# Patient Record
Sex: Female | Born: 1950 | Race: Black or African American | Hispanic: No | Marital: Single | Smoking: Former smoker
Health system: Southern US, Community
[De-identification: ages and names within clinical notes are randomized; demographics above are authoritative.]

## PROBLEM LIST (undated history)

## (undated) DIAGNOSIS — I2510 Atherosclerotic heart disease of native coronary artery without angina pectoris: Secondary | ICD-10-CM

## (undated) DIAGNOSIS — K759 Inflammatory liver disease, unspecified: Secondary | ICD-10-CM

## (undated) DIAGNOSIS — Z8719 Personal history of other diseases of the digestive system: Secondary | ICD-10-CM

## (undated) DIAGNOSIS — Z973 Presence of spectacles and contact lenses: Secondary | ICD-10-CM

## (undated) DIAGNOSIS — M1990 Unspecified osteoarthritis, unspecified site: Secondary | ICD-10-CM

## (undated) DIAGNOSIS — I10 Essential (primary) hypertension: Secondary | ICD-10-CM

## (undated) HISTORY — PX: COLONOSCOPY: SHX174

## (undated) HISTORY — DX: Atherosclerotic heart disease of native coronary artery without angina pectoris: I25.10

## (undated) HISTORY — PX: BREAST SURGERY: SHX581

## (undated) HISTORY — PX: TUBAL LIGATION: SHX77

---

## 1999-10-12 ENCOUNTER — Ambulatory Visit (HOSPITAL_COMMUNITY): Admission: RE | Admit: 1999-10-12 | Discharge: 1999-10-12 | Payer: Self-pay | Admitting: Gastroenterology

## 2001-07-01 ENCOUNTER — Other Ambulatory Visit: Admission: RE | Admit: 2001-07-01 | Discharge: 2001-07-01 | Payer: Self-pay | Admitting: Internal Medicine

## 2002-12-24 ENCOUNTER — Other Ambulatory Visit: Admission: RE | Admit: 2002-12-24 | Discharge: 2002-12-24 | Payer: Self-pay | Admitting: *Deleted

## 2003-06-12 HISTORY — PX: SEPTOPLASTY: SUR1290

## 2004-01-12 ENCOUNTER — Other Ambulatory Visit: Admission: RE | Admit: 2004-01-12 | Discharge: 2004-01-12 | Payer: Self-pay | Admitting: Obstetrics and Gynecology

## 2004-01-26 ENCOUNTER — Encounter: Admission: RE | Admit: 2004-01-26 | Discharge: 2004-01-26 | Payer: Self-pay | Admitting: Obstetrics and Gynecology

## 2004-04-25 ENCOUNTER — Ambulatory Visit (HOSPITAL_BASED_OUTPATIENT_CLINIC_OR_DEPARTMENT_OTHER): Admission: RE | Admit: 2004-04-25 | Discharge: 2004-04-25 | Payer: Self-pay | Admitting: Otolaryngology

## 2005-05-07 ENCOUNTER — Other Ambulatory Visit: Admission: RE | Admit: 2005-05-07 | Discharge: 2005-05-07 | Payer: Self-pay | Admitting: Obstetrics and Gynecology

## 2012-10-11 ENCOUNTER — Emergency Department (HOSPITAL_COMMUNITY)
Admission: EM | Admit: 2012-10-11 | Discharge: 2012-10-11 | Disposition: A | Discharge status: Home / Self Care | Payer: BC Managed Care – PPO | Source: Home / Self Care

## 2012-10-11 ENCOUNTER — Encounter (HOSPITAL_COMMUNITY): Payer: Self-pay | Admitting: Emergency Medicine

## 2012-10-11 DIAGNOSIS — M791 Myalgia, unspecified site: Secondary | ICD-10-CM

## 2012-10-11 DIAGNOSIS — R252 Cramp and spasm: Secondary | ICD-10-CM

## 2012-10-11 DIAGNOSIS — IMO0001 Reserved for inherently not codable concepts without codable children: Secondary | ICD-10-CM

## 2012-10-11 DIAGNOSIS — R112 Nausea with vomiting, unspecified: Secondary | ICD-10-CM

## 2012-10-11 LAB — POCT I-STAT, CHEM 8
BUN: 28 mg/dL — ABNORMAL HIGH (ref 6–23)
Chloride: 99 mEq/L (ref 96–112)
Glucose, Bld: 130 mg/dL — ABNORMAL HIGH (ref 70–99)
Sodium: 138 mEq/L (ref 135–145)
TCO2: 26 mmol/L (ref 0–100)

## 2012-10-11 MED ORDER — TRAMADOL HCL 50 MG PO TABS: 50.0000 mg | ORAL_TABLET | Freq: Four times a day (QID) | ORAL | Status: DC | PRN

## 2012-10-11 MED ORDER — ONDANSETRON HCL 4 MG PO TABS: 4.0000 mg | ORAL_TABLET | Freq: Four times a day (QID) | ORAL | Status: DC

## 2012-10-11 NOTE — ED Provider Notes (Signed)
Medical screening examination/treatment/procedure(s) were performed by non-physician practitioner and as supervising physician I was immediately available for consultation/collaboration.   [facility]; MD  Sharin Grave, MD 10/11/12 2001

## 2012-10-11 NOTE — ED Notes (Signed)
Pt c/o hand,finger, and leg cramps. Pt has a hx of arthritis.  Nausea. Decrease in appetite. Pt has been taking pepto with no relief.  And tylenol for arthritis pain

## 2012-10-11 NOTE — ED Provider Notes (Signed)
History     CSN: [id number]  Arrival date & time 10/11/12  1304   First MD Initiated Contact with Patient 10/11/12 1533      Chief Complaint  Patient presents with  . Arthritis    muscle cramps in legs and hands.  . Nausea    x 4 days decrease appetite.    (Consider location/radiation/quality/duration/timing/severity/associated sxs/prior treatment) HPI Comments: 62 year old female presents with a myriad of chronic complaints and 1 acute concern. The acute concern involves nausea and vomiting for the past 4 days. The nausea is greater than a few times and she has vomited. She has been able to hold down most liquids and eat some. She denies abdominal pain. Also denies diarrhea. She is to having irregular stools for years. Chronic complaints include muscle cramping for over one year but worse in the past few days. These cramps occur "all over my body" sometimes it helps to walk around to relieve her cramps. The cause of her nausea she took Pepto-Bismol and later noticed that her stools were black tarry. She cannot recall whether they were black and tarry prior to or after the Pepto-Bismol. She has intermittent discomfort in her hands and various joints. Does not appear to be acutely ill vital signs are stable. She has a history of hypertension and her physician recently change her medication to losartan/HCTZ.   History reviewed. No pertinent past medical history.  History reviewed. No pertinent past surgical history.  History reviewed. No pertinent family history.  History  Substance Use Topics  . Smoking status: Never Smoker   . Smokeless tobacco: Not on file  . Alcohol Use: Yes    OB History   Grav Para Term Preterm Abortions TAB SAB Ect Mult Living                  Review of Systems  Constitutional: Positive for activity change and fatigue. Negative for fever.  HENT: Negative.   Respiratory: Negative.   Cardiovascular: Negative.   Gastrointestinal:       As per  history of present illness  Genitourinary: Negative.   Musculoskeletal: Positive for myalgias and arthralgias.  Skin: Negative.   Neurological: Positive for weakness. Negative for tremors, seizures, syncope and speech difficulty.  Psychiatric/Behavioral: Positive for sleep disturbance.    Allergies  Review of patient's allergies indicates no known allergies.  Home Medications   Current Outpatient Rx  Name  Route  Sig  Dispense  Refill  . ondansetron (ZOFRAN) 4 MG tablet   Oral   Take 1 tablet (4 mg total) by mouth every 6 (six) hours.   12 tablet   0   . traMADol (ULTRAM) 50 MG tablet   Oral   Take 1 tablet (50 mg total) by mouth every 6 (six) hours as needed for pain.   15 tablet   0     BP 144/91  Pulse 65  Temp(Src) 97.5 F (36.4 C) (Oral)  Resp 19  SpO2 95%  Physical Exam  Nursing note and vitals reviewed. Constitutional: She is oriented to person, place, and time. She appears well-developed and well-nourished. No distress.  HENT:  Mouth/Throat: Oropharynx is clear and moist. No oropharyngeal exudate.  Eyes: EOM are normal.  Neck: Normal range of motion. Neck supple.  Cardiovascular: Normal rate, regular rhythm and normal heart sounds.   Pulmonary/Chest: Effort normal and breath sounds normal. No respiratory distress. She has no wheezes. She has no rales.  Abdominal: Soft. She exhibits no distension. There is  no tenderness.  Genitourinary: Guaiac negative stool.  Anorectal exam is normal, small amount of brown stool in rectal vault. No tenderness.  Musculoskeletal: Normal range of motion. She exhibits no edema.  Mild tenderness and some muscles of the extremities and back. Somewhat nonspecific.  Lymphadenopathy:    She has no cervical adenopathy.  Neurological: She is alert and oriented to person, place, and time. She exhibits normal muscle tone. Coordination normal.  Skin: Skin is warm and dry. No rash noted.  Psychiatric: She has a normal mood and affect.     ED Course  Procedures (including critical care time)  Labs Reviewed  POCT I-STAT, CHEM 8 - Abnormal; Notable for the following:    BUN 28 (*)    Creatinine, Ser 1.50 (*)    Glucose, Bld 130 (*)    Hemoglobin 18.4 (*)    HCT 54.0 (*)    All other components within normal limits   No results found.  Results for orders placed during the hospital encounter of 10/11/12  POCT I-STAT, CHEM 8      Result Value Range   Sodium 138  135 - 145 mEq/L   Potassium 3.8  3.5 - 5.1 mEq/L   Chloride 99  96 - 112 mEq/L   BUN 28 (*) 6 - 23 mg/dL   Creatinine, Ser 8.11 (*) 0.50 - 1.10 mg/dL   Glucose, Bld 914 (*) 70 - 99 mg/dL   Calcium, Ion 7.82  9.56 - 1.30 mmol/L   TCO2 26  0 - 100 mmol/L   Hemoglobin 18.4 (*) 12.0 - 15.0 g/dL   HCT 21.3 (*) 08.6 - 57.8 %      1. Myalgia   2. Muscle cramps   3. Nausea & vomiting       MDM  Blood tests is normal does not offer an etiology for her chronic leg cramps and myalgias. I suspect her nausea and vomiting may be due to a viral gastritis which has been epidemic to the area for the past 6 weeks. That has improved somewhat. Zofran 4 mg by mouth every 6 hours when necessary nausea and vomiting Tramadol 50 mg every 6 hours when necessary cramps and pain. Heat followup with your primary care doctor next week.        Hayden Rasmussen, NP 10/11/12 1652

## 2012-10-13 LAB — OCCULT BLOOD, POC DEVICE: Fecal Occult Bld: NEGATIVE

## 2013-02-05 ENCOUNTER — Other Ambulatory Visit: Payer: Self-pay | Admitting: Obstetrics and Gynecology

## 2013-02-05 DIAGNOSIS — R928 Other abnormal and inconclusive findings on diagnostic imaging of breast: Secondary | ICD-10-CM

## 2013-02-24 ENCOUNTER — Ambulatory Visit
Admission: RE | Admit: 2013-02-24 | Discharge: 2013-02-24 | Disposition: A | Discharge status: Home / Self Care | Payer: BC Managed Care – PPO | Source: Ambulatory Visit | Attending: Obstetrics and Gynecology | Admitting: Obstetrics and Gynecology

## 2013-02-24 DIAGNOSIS — R928 Other abnormal and inconclusive findings on diagnostic imaging of breast: Secondary | ICD-10-CM

## 2013-06-10 ENCOUNTER — Other Ambulatory Visit (HOSPITAL_COMMUNITY): Payer: BC Managed Care – PPO

## 2013-08-19 ENCOUNTER — Ambulatory Visit (HOSPITAL_COMMUNITY): Payer: BC Managed Care – PPO | Attending: Cardiovascular Disease | Admitting: Cardiology

## 2013-08-19 ENCOUNTER — Encounter: Payer: Self-pay | Admitting: Cardiovascular Disease

## 2013-08-19 ENCOUNTER — Other Ambulatory Visit (HOSPITAL_COMMUNITY): Payer: Self-pay | Admitting: Internal Medicine

## 2013-08-19 DIAGNOSIS — I509 Heart failure, unspecified: Secondary | ICD-10-CM | POA: Insufficient documentation

## 2013-08-19 NOTE — Progress Notes (Signed)
Echo performed. 

## 2013-08-22 ENCOUNTER — Encounter: Payer: Self-pay | Admitting: *Deleted

## 2013-08-22 DIAGNOSIS — I10 Essential (primary) hypertension: Secondary | ICD-10-CM | POA: Insufficient documentation

## 2013-09-07 ENCOUNTER — Other Ambulatory Visit: Payer: Self-pay | Admitting: Gastroenterology

## 2013-10-05 ENCOUNTER — Encounter (HOSPITAL_COMMUNITY): Payer: Self-pay | Admitting: Pharmacy Technician

## 2013-10-12 ENCOUNTER — Other Ambulatory Visit: Payer: Self-pay | Admitting: Internal Medicine

## 2013-10-12 DIAGNOSIS — R945 Abnormal results of liver function studies: Principal | ICD-10-CM

## 2013-10-12 DIAGNOSIS — R7989 Other specified abnormal findings of blood chemistry: Secondary | ICD-10-CM

## 2013-10-14 ENCOUNTER — Encounter (HOSPITAL_COMMUNITY): Payer: Self-pay | Admitting: *Deleted

## 2013-10-14 ENCOUNTER — Encounter (INDEPENDENT_AMBULATORY_CARE_PROVIDER_SITE_OTHER): Payer: Self-pay

## 2013-10-14 ENCOUNTER — Ambulatory Visit
Admission: RE | Admit: 2013-10-14 | Discharge: 2013-10-14 | Disposition: A | Discharge status: Home / Self Care | Payer: BC Managed Care – PPO | Source: Ambulatory Visit | Attending: Internal Medicine | Admitting: Internal Medicine

## 2013-10-14 DIAGNOSIS — R945 Abnormal results of liver function studies: Principal | ICD-10-CM

## 2013-10-14 DIAGNOSIS — R7989 Other specified abnormal findings of blood chemistry: Secondary | ICD-10-CM

## 2013-10-27 ENCOUNTER — Encounter (HOSPITAL_COMMUNITY)
Admission: RE | Disposition: A | Discharge status: Home / Self Care | Payer: Self-pay | Source: Ambulatory Visit | Attending: Gastroenterology

## 2013-10-27 ENCOUNTER — Ambulatory Visit (HOSPITAL_COMMUNITY): Payer: BC Managed Care – PPO | Admitting: Anesthesiology

## 2013-10-27 ENCOUNTER — Ambulatory Visit (HOSPITAL_COMMUNITY)
Admission: RE | Admit: 2013-10-27 | Discharge: 2013-10-27 | Disposition: A | Discharge status: Home / Self Care | Payer: BC Managed Care – PPO | Source: Ambulatory Visit | Attending: Gastroenterology | Admitting: Gastroenterology

## 2013-10-27 ENCOUNTER — Encounter (HOSPITAL_COMMUNITY): Payer: Self-pay | Admitting: *Deleted

## 2013-10-27 ENCOUNTER — Encounter (HOSPITAL_COMMUNITY): Payer: BC Managed Care – PPO | Admitting: Anesthesiology

## 2013-10-27 DIAGNOSIS — Z1211 Encounter for screening for malignant neoplasm of colon: Secondary | ICD-10-CM | POA: Insufficient documentation

## 2013-10-27 DIAGNOSIS — I10 Essential (primary) hypertension: Secondary | ICD-10-CM | POA: Insufficient documentation

## 2013-10-27 DIAGNOSIS — F17200 Nicotine dependence, unspecified, uncomplicated: Secondary | ICD-10-CM | POA: Insufficient documentation

## 2013-10-27 HISTORY — DX: Inflammatory liver disease, unspecified: K75.9

## 2013-10-27 HISTORY — DX: Unspecified osteoarthritis, unspecified site: M19.90

## 2013-10-27 HISTORY — PX: COLONOSCOPY WITH PROPOFOL: SHX5780

## 2013-10-27 HISTORY — DX: Personal history of other diseases of the digestive system: Z87.19

## 2013-10-27 HISTORY — DX: Essential (primary) hypertension: I10

## 2013-10-27 SURGERY — COLONOSCOPY WITH PROPOFOL
Anesthesia: Monitor Anesthesia Care

## 2013-10-27 MED ORDER — PROPOFOL 10 MG/ML IV BOLUS
INTRAVENOUS | Status: DC | PRN
  Administered 2013-10-27 (×5): 50 mg via INTRAVENOUS

## 2013-10-27 MED ORDER — PROPOFOL 10 MG/ML IV BOLUS
INTRAVENOUS | Status: AC
  Filled 2013-10-27: qty 20

## 2013-10-27 MED ORDER — SODIUM CHLORIDE 0.9 % IV SOLN: INTRAVENOUS | Status: DC

## 2013-10-27 MED ORDER — LACTATED RINGERS IV SOLN
INTRAVENOUS | Status: DC
  Administered 2013-10-27: 1000 mL via INTRAVENOUS

## 2013-10-27 MED ORDER — LACTATED RINGERS IV SOLN
INTRAVENOUS | Status: DC | PRN
  Administered 2013-10-27: 11:00:00 via INTRAVENOUS

## 2013-10-27 SURGICAL SUPPLY — 22 items

## 2013-10-27 NOTE — H&P (Signed)
  Procedure: Screening colonoscopy. Normal screening colonoscopy performed 10/12/1999  History: The patient is a 63 year old female born [birth year]. She is scheduled to undergo a repeat screening colonoscopy with polypectomy to prevent colon cancer.  Past medical history: Chronic viral hepatitis C. Seasonal allergies. Hypertension. Diastolic dysfunction complicated by heart failure. Left bundle branch block pattern by electrocardiogram. Septoplasty.  Medication allergies: None  Exam: The patient is alert and lying comfortably on the endoscopy stretcher. Abdomen is soft and nontender to palpation. Lungs are clear to auscultation. Cardiac exam reveals a regular rhythm.  Plan: Proceed with repeat screening colonoscopy

## 2013-10-27 NOTE — Anesthesia Postprocedure Evaluation (Signed)
  Anesthesia Post-op Note  Patient: Patricia Aguirre  Procedure(s) Performed: Procedure(s) (LRB): COLONOSCOPY WITH PROPOFOL (N/A)  Patient Location: PACU  Anesthesia Type: MAC  Level of Consciousness: awake and alert   Airway and Oxygen Therapy: Patient Spontanous Breathing  Post-op Pain: mild  Post-op Assessment: Post-op Vital signs reviewed, Patient's Cardiovascular Status Stable, Respiratory Function Stable, Patent Airway and No signs of Nausea or vomiting  Last Vitals:  Filed Vitals:   10/27/13 1139  BP: 93/51  Pulse: 55  Temp:   Resp: 18    Post-op Vital Signs: stable   Complications: No apparent anesthesia complications

## 2013-10-27 NOTE — Transfer of Care (Signed)
Immediate Anesthesia Transfer of Care Note  Patient: Patricia Aguirre  Procedure(s) Performed: Procedure(s): COLONOSCOPY WITH PROPOFOL (N/A)  Patient Location: PACU  Anesthesia Type:MAC  Level of Consciousness: awake, sedated and patient cooperative  Airway & Oxygen Therapy: Patient Spontanous Breathing and Patient connected to face mask oxygen  Post-op Assessment: Report given to PACU RN and Post -op Vital signs reviewed and stable  Post vital signs: Reviewed and stable  Complications: No apparent anesthesia complications

## 2013-10-27 NOTE — Discharge Instructions (Signed)
Colonoscopy, Care After °Refer to this sheet in the next few weeks. These instructions provide you with information on caring for yourself after your procedure. Your health care provider may also give you more specific instructions. Your treatment has been planned according to current medical practices, but problems sometimes occur. Call your health care provider if you have any problems or questions after your procedure. °WHAT TO EXPECT AFTER THE PROCEDURE  °After your procedure, it is typical to have the following: °· A small amount of blood in your stool. °· Moderate amounts of gas and mild abdominal cramping or bloating. °HOME CARE INSTRUCTIONS °· Do not drive, operate machinery, or sign important documents for 24 hours. °· You may shower and resume your regular physical activities, but move at a slower pace for the first 24 hours. °· Take frequent rest periods for the first 24 hours. °· Walk around or put a warm pack on your abdomen to help reduce abdominal cramping and bloating. °· Drink enough fluids to keep your urine clear or pale yellow. °· You may resume your normal diet as instructed by your health care provider. Avoid heavy or fried foods that are hard to digest. °· Avoid drinking alcohol for 24 hours or as instructed by your health care provider. °· Only take over-the-counter or prescription medicines as directed by your health care provider. °· If a tissue sample (biopsy) was taken during your procedure: °· Do not take aspirin or blood thinners for 7 days, or as instructed by your health care provider. °· Do not drink alcohol for 7 days, or as instructed by your health care provider. °· Eat soft foods for the first 24 hours. °SEEK MEDICAL CARE IF: °You have persistent spotting of blood in your stool 2 3 days after the procedure. °SEEK IMMEDIATE MEDICAL CARE IF: °· You have more than a small spotting of blood in your stool. °· You pass large blood clots in your stool. °· Your abdomen is swollen  (distended). °· You have nausea or vomiting. °· You have a fever. °· You have increasing abdominal pain that is not relieved with medicine. °Document Released: 01/10/2004 Document Revised: 03/18/2013 Document Reviewed: 02/02/2013 °ExitCare® Patient Information ©2014 [employer]. ° °

## 2013-10-27 NOTE — Anesthesia Preprocedure Evaluation (Signed)
Anesthesia Evaluation  Patient identified by MRN, date of birth, ID band Patient awake    Reviewed: Allergy & Precautions, H&P , NPO status , Patient's Chart, lab work & pertinent test results  Airway Mallampati: II TM Distance: >3 FB Neck ROM: Full    Dental no notable dental hx.    Pulmonary Current Smoker,  breath sounds clear to auscultation  Pulmonary exam normal       Cardiovascular hypertension, Pt. on medications Rhythm:Regular Rate:Normal     Neuro/Psych negative neurological ROS  negative psych ROS   GI/Hepatic negative GI ROS, Neg liver ROS,   Endo/Other  negative endocrine ROS  Renal/GU negative Renal ROS  negative genitourinary   Musculoskeletal negative musculoskeletal ROS (+)   Abdominal   Peds negative pediatric ROS (+)  Hematology negative hematology ROS (+)   Anesthesia Other Findings   Reproductive/Obstetrics negative OB ROS                           Anesthesia Physical Anesthesia Plan  ASA: II  Anesthesia Plan: MAC   Post-op Pain Management:    Induction: Intravenous  Airway Management Planned: Nasal Cannula  Additional Equipment:   Intra-op Plan:   Post-operative Plan:   Informed Consent: I have reviewed the patients History and Physical, chart, labs and discussed the procedure including the risks, benefits and alternatives for the proposed anesthesia with the patient or authorized representative who has indicated his/her understanding and acceptance.   Dental advisory given  Plan Discussed with: CRNA and Surgeon  Anesthesia Plan Comments:         Anesthesia Quick Evaluation

## 2013-10-27 NOTE — Op Note (Signed)
Procedure: Screening colonoscopy. Normal screening colonoscopies performed 10/12/1999.  Endoscopist: Danise EdgeMartin Torien Ramroop  Premedication: Propofol administered by anesthesia  Procedure: The patient was placed in the left lateral decubitus position. Anal inspection and digital rectal exam were normal. The Pentax pediatric colonoscope was introduced into the rectum and advanced to the cecum. A normal-appearing appendiceal orifice and ileocecal valve were identified. Colonic preparation for the exam today was good.  Rectum. Normal. Retroflexed view of the distal rectum normal  Sigmoid colon and descending colon. Normal  Splenic flexure. Normal  Transverse colon. Normal  Hepatic flexure. Normal  Ascending colon. Normal  Cecum and ileocecal valve. Normal  Assessment: Normal screening colonoscopy  Recommendation: Schedule repeat screening colonoscopy in 10 years

## 2013-10-28 ENCOUNTER — Encounter (HOSPITAL_COMMUNITY): Payer: Self-pay | Admitting: Gastroenterology

## 2013-11-23 ENCOUNTER — Ambulatory Visit (INDEPENDENT_AMBULATORY_CARE_PROVIDER_SITE_OTHER): Payer: BC Managed Care – PPO | Admitting: Internal Medicine

## 2013-11-23 ENCOUNTER — Encounter: Payer: Self-pay | Admitting: Internal Medicine

## 2013-11-23 VITALS — BP 122/78 | HR 80 | Temp 98.4°F | Ht 66.0 in | Wt 161.0 lb

## 2013-11-23 DIAGNOSIS — G9340 Encephalopathy, unspecified: Secondary | ICD-10-CM

## 2013-11-23 DIAGNOSIS — B1920 Unspecified viral hepatitis C without hepatic coma: Secondary | ICD-10-CM

## 2013-11-23 DIAGNOSIS — K7290 Hepatic failure, unspecified without coma: Secondary | ICD-10-CM | POA: Insufficient documentation

## 2013-11-23 DIAGNOSIS — K7682 Hepatic encephalopathy: Secondary | ICD-10-CM | POA: Insufficient documentation

## 2013-11-23 LAB — COMPLETE METABOLIC PANEL WITH GFR
ALBUMIN: 3.3 g/dL — AB (ref 3.5–5.2)
ALK PHOS: 140 U/L — AB (ref 39–117)
ALT: 47 U/L — ABNORMAL HIGH (ref 0–35)
AST: 91 U/L — AB (ref 0–37)
BUN: 8 mg/dL (ref 6–23)
CALCIUM: 9 mg/dL (ref 8.4–10.5)
CO2: 25 mEq/L (ref 19–32)
Chloride: 109 mEq/L (ref 96–112)
Creat: 0.61 mg/dL (ref 0.50–1.10)
GFR, Est African American: >89 mL/min
GFR, Est Non African American: >89 mL/min
Glucose, Bld: 100 mg/dL — ABNORMAL HIGH (ref 70–99)
POTASSIUM: 3.4 meq/L — AB (ref 3.5–5.3)
SODIUM: 140 meq/L (ref 135–145)
TOTAL PROTEIN: 7.5 g/dL (ref 6.0–8.3)
Total Bilirubin: 1.2 mg/dL (ref 0.2–1.2)

## 2013-11-23 LAB — CBC WITH DIFFERENTIAL/PLATELET
BASOS PCT: 1 % (ref 0–1)
Basophils Absolute: 0.1 10*3/uL (ref 0.0–0.1)
EOS ABS: 0.1 10*3/uL (ref 0.0–0.7)
Eosinophils Relative: 2 % (ref 0–5)
HEMATOCRIT: 38.9 % (ref 36.0–46.0)
Hemoglobin: 13.5 g/dL (ref 12.0–15.0)
Lymphocytes Relative: 45 % (ref 12–46)
Lymphs Abs: 3.2 10*3/uL (ref 0.7–4.0)
MCH: 32.9 pg (ref 26.0–34.0)
MCHC: 34.7 g/dL (ref 30.0–36.0)
MCV: 94.9 fL (ref 78.0–100.0)
MONO ABS: 0.5 10*3/uL (ref 0.1–1.0)
Monocytes Relative: 7 % (ref 3–12)
Neutro Abs: 3.2 10*3/uL (ref 1.7–7.7)
Neutrophils Relative %: 45 % (ref 43–77)
PLATELETS: 162 10*3/uL (ref 150–400)
RBC: 4.1 MIL/uL (ref 3.87–5.11)
RDW: 14.2 % (ref 11.5–15.5)
WBC: 7 10*3/uL (ref 4.0–10.5)

## 2013-11-23 LAB — IRON: IRON: 211 ug/dL — AB (ref 42–145)

## 2013-11-23 LAB — AMMONIA: Ammonia: 65 umol/L — ABNORMAL HIGH (ref 16–53)

## 2013-11-23 LAB — PROTIME-INR
INR: 1.13 (ref <1.50)
Prothrombin Time: 14.4 seconds (ref 11.6–15.2)

## 2013-11-23 NOTE — Progress Notes (Signed)
+Patricia Aguirre is a 63 y.o. female who presents for initial evaluation and management of a positive Hepatitis C antibody test.  Patient tested positive this year. Test was performed as part of an evaluation of abnormal liver function test:(elevated ALT, elevated AST). Hepatitis C risk factors present are: tattoos (details: done in the 1970s). Patient denies accidental needle stick, acupuncture, history of blood transfusion, history of clotting factor transfusion, intranasal drug use, IV drug abuse, multiple sexual partners, renal dialysis, sexual contact with person with liver disease. Patient has not had other studies performed. Results: hepatitis C RNA by PCR, result: positive. Patient has not had prior treatment for Hepatitis C. Patient does not have a past history of liver disease. Patient does not have a family history of liver disease.   HPI: Patricia Aguirre was recently diagnosed by Dr. Nehemiah SettlePolite and denies any risk factors.  Has a history of mild renal insufficiency but recent labs with normal creatinine.  Also recent positive ANA being evaluated by rheumatology with concern for autoimmune disease.    Patient does not have documented immunity to Hepatitis A. Patient does not have documented immunity to Hepatitis B.     Review of Systems A comprehensive review of systems was negative.   Past Medical History  Diagnosis Date  . Coronary artery disease   . Hypertension   . H/O hiatal hernia   . Arthritis   . Hepatitis     Hep C; dx 1 week ago 10/2113    History  Substance Use Topics  . Smoking status: Current Some Day Smoker -- 0.25 packs/day for 20 years    Types: Cigarettes  . Smokeless tobacco: Not on file  . Alcohol Use: Yes     Comment: socially    Family History  Problem Relation Age of Onset  . Diabetes Mellitus I Mother   . Coronary artery disease Father   . Heart disease Father       Objective:   Filed Vitals:   11/23/13 1117  BP: 122/78  Pulse: 80  Temp: 98.4 F (36.9 C)    in no apparent distress, well developed and well nourished and anicteric.  Patricia Aguirre has slowed mentation and has difficulty concentrating.   HEENT: anicteric Cor RRR and No murmurs clear Bowel sounds are normal, liver is not enlarged, spleen is not enlarged peripheral pulses normal, no pedal edema, no clubbing or cyanosis negative for - jaundice, spider hemangioma, telangiectasia, palmar erythema, ecchymosis and atrophy  Laboratory Genotype: unknown No results found for this basename: hcvgenotype   HCV viral load: 895,960 (through [facility]) No results found for this basename: HCVQUANT   Lab Results  Component Value Date   HGB 18.4* 10/11/2012   HCT 54.0* 10/11/2012    Lab Results  Component Value Date   CREATININE 1.50* 10/11/2012   BUN 28* 10/11/2012   NA 138 10/11/2012   K 3.8 10/11/2012   CL 99 10/11/2012    No results found for this basename: ALT, AST, GGT, ALKPHOS, BILITOT, INR      Assessment: Hepatitis C genotype unknown  Plan: 1) Patient counseled extensively on limiting acetaminophen to no more than 2 grams daily, avoidance of alcohol. 2) Transmission discussed with patient including sexual transmission, sharing razors and toothbrush.   3) Will need referral to gastroenterology: no, awaiting elastrography results.  Has seen [facility] in the past (Dr. Loreta AveMann).   4) Will need referral for substance abuse counseling: no, depending on work up.  5) Hepatitis A vaccine no, will  check antibodies 6) Hepatitis B vaccine no will check antibodies 7) Pneumovax vaccine no 8) Encephalopathy - Patricia Aguirre is slowed in mentation today.  No known history of any encephalopathy.  Will check ammonia, UDS, alcohol, electrolytes, calcium.

## 2013-11-24 LAB — HEPATITIS B SURFACE ANTIBODY,QUALITATIVE: Hep B S Ab: NEGATIVE

## 2013-11-24 LAB — ETHANOL: Alcohol, Ethyl (B): <10 mg/dL (ref 0–10)

## 2013-11-24 LAB — ANA: Anti Nuclear Antibody(ANA): NEGATIVE

## 2013-11-24 LAB — HIV ANTIBODY (ROUTINE TESTING W REFLEX): HIV 1&2 Ab, 4th Generation: NONREACTIVE

## 2013-11-24 LAB — HCV RNA QUANT RFLX ULTRA OR GENOTYP
HCV QUANT LOG: 6.85 {Log} — AB (ref <1.18)
HCV QUANT: 7140780 [IU]/mL — AB (ref <15)

## 2013-11-24 LAB — HEPATITIS B CORE ANTIBODY, TOTAL: Hep B Core Total Ab: NONREACTIVE

## 2013-11-24 LAB — HEPATITIS B SURFACE ANTIGEN: Hepatitis B Surface Ag: NEGATIVE

## 2013-11-24 LAB — HEPATITIS A ANTIBODY, TOTAL: Hep A Total Ab: NONREACTIVE

## 2013-11-25 LAB — HEPATITIS C GENOTYPE

## 2013-12-01 ENCOUNTER — Ambulatory Visit (HOSPITAL_COMMUNITY)
Admission: RE | Admit: 2013-12-01 | Discharge: 2013-12-01 | Disposition: A | Discharge status: Home / Self Care | Payer: BC Managed Care – PPO | Source: Ambulatory Visit | Attending: Internal Medicine | Admitting: Internal Medicine

## 2013-12-01 DIAGNOSIS — B1920 Unspecified viral hepatitis C without hepatic coma: Secondary | ICD-10-CM | POA: Insufficient documentation

## 2013-12-14 ENCOUNTER — Telehealth: Payer: Self-pay | Admitting: *Deleted

## 2013-12-14 ENCOUNTER — Encounter: Payer: Self-pay | Admitting: Internal Medicine

## 2013-12-14 ENCOUNTER — Ambulatory Visit (INDEPENDENT_AMBULATORY_CARE_PROVIDER_SITE_OTHER): Payer: BC Managed Care – PPO | Admitting: Internal Medicine

## 2013-12-14 VITALS — BP 156/84 | HR 65 | Temp 98.4°F | Wt 165.0 lb

## 2013-12-14 DIAGNOSIS — K7290 Hepatic failure, unspecified without coma: Secondary | ICD-10-CM

## 2013-12-14 DIAGNOSIS — B182 Chronic viral hepatitis C: Secondary | ICD-10-CM

## 2013-12-14 DIAGNOSIS — K7460 Unspecified cirrhosis of liver: Secondary | ICD-10-CM

## 2013-12-14 DIAGNOSIS — K7682 Hepatic encephalopathy: Secondary | ICD-10-CM

## 2013-12-14 MED ORDER — LEDIPASVIR-SOFOSBUVIR 90-400 MG PO TABS: 1.0000 | ORAL_TABLET | Freq: Every day | ORAL | Status: DC

## 2013-12-14 NOTE — Progress Notes (Signed)
   Subjective:    Patient ID: Gardiner RamusJean C Wentland, female    DOB: 05-11-1951, 63 y.o.   MRN: [id number]  HPI Comes in for follow up for HCV.  Recently diagnosed and history of tattoos but denies any other risk factors.  Work up has revealed genotype 1b, viral load of 7,140,780, hep A and B non immune.  Elastography with F4 and noted symptoms of hepatic encephalopathy with mild elevation in ammonia level.  Also gives a history of confusion, difficulty concentrating.     Child-Pugh A, though with encephalopathy, mild decrease in albumin.    Will start hepatitis A and B vaccine (pt left before getting it today)  Has had recent pneumovax by her PCP.      Review of Systems     Objective:   Physical Exam        Assessment & Plan:

## 2013-12-14 NOTE — Telephone Encounter (Signed)
Called patient and notified her of appointment [facility] [place] for 12/21/13 at 8:15 AM. Office note, labs, and ultrasound results faxed to [phone]. [name]

## 2013-12-14 NOTE — Assessment & Plan Note (Addendum)
Will prescribe Harvoni.  12 weeks if/when approved.  Will call her to schedule once it is approved.    Will need to take ppi at same time or not at all.  Does not take daily.

## 2013-12-14 NOTE — Assessment & Plan Note (Signed)
Will send to GI

## 2013-12-14 NOTE — Assessment & Plan Note (Addendum)
[facility].  Ultrasound done.  Discussed results with the patient, meaning of cirrhosis, possibility of hepatic encephalopathy as cause of her decline over last few years.  Counseled on avoidance of alcohol (does not drink), avoidance of acetaminophen.   40 minutes spent with 20 minutes spent face to face counseling on issues, medicine.

## 2013-12-14 NOTE — Patient Instructions (Signed)
Date 12/14/2013  Dear Ms Patricia Aguirre, As discussed in the ID Clinic, your hepatitis C therapy will include the following medications:           Harvoni 90mg /400mg  tablet:           Take 1 tablet by mouth once daily   Please note that ALL MEDICATIONS WILL START ON THE SAME DATE for a total of _ weeks. ---------------------------------------------------------------- Your HCV Treatment Start Date: TBA   Your HCV genotype:  1b    Liver Fibrosis:    F4 ---------------------------------------------------------------- YOUR [facility]AC[facility] CONTACT:   [facility] [address] Phone: [phone] Hours: Monday to Friday 7:30 am to 6:00 pm   Please always contact your pharmacy at least 3-4 business days before you run out of medications to ensure your next month's medication is ready or 1 week prior to running out if you receive it by mail.  Remember, each prescription is for 28 days. ---------------------------------------------------------------- GENERAL NOTES REGARDING YOUR HEPATITIS C MEDICATIONS:  SOFOSBUVIR/LEDIPASVIR (HARVONI): - Harvoni tablet is taken daily with OR without food. - The tablets are orange. - The tablets should be stored at room temperature.  - Acid reducing agents such as H2 blockers (ie. Pepcid (famotidine), Zantac (ranitidine), Tagamet (cimetidine), Axid (nizatidine) and proton pump inhibitors (ie. Prilosec (omeprazole), Protonix (pantoprazole), Nexium (esomeprazole), or Aciphex (rabeprazole)) can decrease effectiveness of Harvoni. Do not take until you have discussed with a health care provider.    -Antacids that contain magnesium and/or aluminum hydroxide (ie. Milk of Magensia, Rolaids, Gaviscon, Maalox, Mylanta, an dArthritis Pain Formula)can reduce absorption of Harvoni, so take them at least 4 hours before or after Harvoni.  -Calcium carbonate (calcium supplements or antacids such as Tums, Caltrate,  Os-Cal)needs to be taken at least 4 hours hours before or after Harvoni.  -St. John's wort or any products that contain St. John's wort like some herbal supplements  Please inform the office prior to starting any of these medications.  - The common side effects with Harvoni:      1. Fatigue      2. Headache      3. Nausea      4. Diarrhea      5. Insomnia   Support Path is a suite of resources designed to help patients start with HARVONI and move toward treatment completion GETTING STARTED Support Path helps patients access therapy and get off to an efficient start  Benefits investigation and prior authorization support Co-pay and other financial assistance A specialty pharmacy finder CO-PAY COUPON The HARVONI co-pay coupon may help eligible patients lower their out-of-pocket costs. With a co-pay coupon, most eligible patients may pay no more than $5 per co-pay (restrictions apply) www.harvoni.com call (334)[phone] Not valid for patients enrolled in government healthcare prescription drug programs, such as Medicare Part D and Medicaid. Patients in the coverage gap known as the "donut hole" also are not eligible The HARVONI co-pay coupon program will cover the out-of-pocket costs for HARVONI prescriptions up to a maximum of 25% of the catalog price of a 12-week regimen of HARVONI  Please note that this only lists the most common side effects and is NOT a comprehensive list of the potential side effects of these medications. For more information, please review the drug information sheets that come with your medication package from the pharmacy.  ---------------------------------------------------------------- GENERAL HELPFUL HINTS ON HCV THERAPY: 1. No alcohol. 2. Protect against sun-sensitivity/sunburns (wear sunglasses, hat, long sleeves,  pants and sunscreen). 3. Stay well-hydrated/well-moisturized. 4. Notify the ID Clinic of any changes in your other over-the-counter/herbal or  prescription medications. 5. If you miss a dose of your medication, take the missed dose as soon as you remember. Return to your regular time/dose schedule the next day.  6.  Do not stop taking your medications without first talking with your healthcare provider. 7.  You may take Tylenol (acetaminophen), as long as the dose is less than 2000 mg (OR no more than 4 tablets of the Tylenol Extra Strengths 500mg  tablet) in 24 hours. 8.  You will need to obtain routine labs and/or office visits at [facility] at weeks 2, 4, 8,  and 12 as well as 12 and 24 weeks after completion of treatment.  If you are not compliant with labs and office visits, we may discontinue HCV therapy.  Scharlene Gloss, [place] for [place] [facility], [place]  [phone]

## 2013-12-21 ENCOUNTER — Other Ambulatory Visit: Payer: Self-pay | Admitting: Gastroenterology

## 2014-01-13 ENCOUNTER — Telehealth: Payer: Self-pay | Admitting: *Deleted

## 2014-01-13 NOTE — Telephone Encounter (Signed)
Message copied by Andree CossHOWELL, Lunell Robart M on Wed Jan 13, 2014 12:06 PM ------      Message from: Gardiner BarefootOMER, ROBERT W      Created: Wed Jan 13, 2014 11:56 AM       She should be hearing from pharmacy to start Harvoni.  She can take omeprazole if she needs to but at the same time as Harvoni and on an empty stomach.  She should follow up with me about 2 weeks after starting, ok to overbook. thanks ------

## 2014-01-13 NOTE — Telephone Encounter (Signed)
Patient will pick up her rx today.  Explained how to take Harvoni/Prilosec.  She will call with any questions.  She is scheduled for follow up 8/20 10:00. Andree CossHowell, Zaniah Titterington M, RN

## 2014-01-14 ENCOUNTER — Telehealth: Payer: Self-pay | Admitting: *Deleted

## 2014-01-14 DIAGNOSIS — B1920 Unspecified viral hepatitis C without hepatic coma: Secondary | ICD-10-CM

## 2014-01-14 MED ORDER — LEDIPASVIR-SOFOSBUVIR 90-400 MG PO TABS: 1.0000 | ORAL_TABLET | Freq: Every day | ORAL | Status: DC

## 2014-01-14 NOTE — Telephone Encounter (Signed)
Patricia Aguirre, phramacist at [facility] unclear about whether the pt has received the Harvoni rx.  Patricia Aguirre [facility] the pt would need to use the mail order specialty pharmacy Patricia Aguirre.    RN called Patricia Aguirre.  Patricia [facility] had not received the rx.  Gave a verbal order for Harvoni to the [employer] [facility] pharmacist and answered a list of questions related to the pt's lab work and imaging.  Also, the patient needs to set up an account with Patricia Aguirre in order to receive mail order medications.    Phone call to the pt to give her the information about how to set up an account with Patricia Aguirre [phone]).  Pt repeated back the information.  RN requested that the pt call Patricia Aguirre to tell us the first day she starts the Harvoni.  We will set up a return appt w/ Dr. Luciana Aguirre for 2 weeks after starting the medication.  Pt verbalized understanding.  Left Message for Patricia Aguirre, Pharmacist with above information.

## 2014-01-25 ENCOUNTER — Encounter: Payer: Self-pay | Admitting: *Deleted

## 2014-01-25 NOTE — Telephone Encounter (Signed)
Error

## 2014-01-25 NOTE — Telephone Encounter (Signed)
Patient called and asked to speak to [facility] about her Harvoni Rx. Advised the patient will have RN call her asap.

## 2014-01-28 ENCOUNTER — Ambulatory Visit: Payer: BC Managed Care – PPO | Admitting: Internal Medicine

## 2014-01-28 ENCOUNTER — Telehealth: Payer: Self-pay | Admitting: *Deleted

## 2014-01-28 NOTE — Telephone Encounter (Signed)
OP Pharmacy, Brett CanalesSteve, has contacted the pt.  Pt is aware that the "hold up" is [employer]the insurance company, [employer].  Almon RegisterSteve Furr, pharmacist, is continuing to contact BCBS to obtain authorization.  Having difficulty with [employer]BS.

## 2014-01-28 NOTE — Telephone Encounter (Signed)
Ultrasound Elastography Psychologist, occupationalMachine Manufacturer per [place] must be either [employer] or [employer].  [employer] of [place] does not accept any other manufacturers.  [employer] of [place] accepts the following alternative methods of documenting hepatic fibrosis:  Fibroscan, US/MRI diffinative results for cirrhosis documenting portal hypertension and/or esophageal varices, or Liver Biopsy.  If any of these are completed the results may be faxed to [facility] at [id number] usinf reference # [id number].  Patricia Aguirre is contacting the patient to let her know the denial from [facility] [employer].  After contacting [facility]) and [facility] neither of these businesses have the [employer]ill[employer] or [employer] machines.  [facility] is "in the process" of reviewing/evluating US elastography machines at the present time.  RN notified Brett CanalesSteve at [facility] with this information.  Will forward to M. Arlester MarkerPham, pharmacist.

## 2014-01-29 NOTE — Telephone Encounter (Signed)
I checked [facility], they have the same one we do.  If patient is agreeable, we will have to do a biopsy (IR does it here).  thanks

## 2014-02-01 NOTE — Telephone Encounter (Signed)
Spoke with pt.  Deciding and will call back.

## 2014-02-01 NOTE — Telephone Encounter (Addendum)
Dr Luciana Axe asking the pt if she would agree to a liver biopsy.  Ultrasound elastography done not accepted by [facility]

## 2014-02-02 NOTE — Telephone Encounter (Signed)
Why am I getting this message re [facility], this is Dr Ephriam Knuckles pt correct?

## 2014-02-08 ENCOUNTER — Encounter (HOSPITAL_COMMUNITY): Admission: RE | Payer: Self-pay | Source: Ambulatory Visit

## 2014-02-08 ENCOUNTER — Ambulatory Visit (HOSPITAL_COMMUNITY)
Admission: RE | Admit: 2014-02-08 | Payer: BC Managed Care – PPO | Source: Ambulatory Visit | Admitting: Gastroenterology

## 2014-02-08 SURGERY — ESOPHAGOGASTRODUODENOSCOPY (EGD) WITH PROPOFOL
Anesthesia: Monitor Anesthesia Care

## 2014-03-15 ENCOUNTER — Ambulatory Visit (HOSPITAL_COMMUNITY)
Admission: RE | Admit: 2014-03-15 | Discharge: 2014-03-15 | Disposition: A | Discharge status: Home / Self Care | Payer: BC Managed Care – PPO | Source: Ambulatory Visit | Attending: Obstetrics and Gynecology | Admitting: Obstetrics and Gynecology

## 2014-03-15 ENCOUNTER — Encounter (INDEPENDENT_AMBULATORY_CARE_PROVIDER_SITE_OTHER): Payer: Self-pay

## 2014-03-15 ENCOUNTER — Other Ambulatory Visit (HOSPITAL_COMMUNITY): Payer: Self-pay | Admitting: Obstetrics and Gynecology

## 2014-03-15 DIAGNOSIS — J9811 Atelectasis: Secondary | ICD-10-CM | POA: Insufficient documentation

## 2014-03-15 DIAGNOSIS — R0602 Shortness of breath: Secondary | ICD-10-CM | POA: Diagnosis not present

## 2014-03-15 DIAGNOSIS — R05 Cough: Secondary | ICD-10-CM | POA: Insufficient documentation

## 2014-03-15 DIAGNOSIS — R053 Chronic cough: Secondary | ICD-10-CM

## 2014-03-15 DIAGNOSIS — R079 Chest pain, unspecified: Secondary | ICD-10-CM | POA: Insufficient documentation

## 2014-03-17 ENCOUNTER — Other Ambulatory Visit: Payer: Self-pay | Admitting: Obstetrics and Gynecology

## 2014-03-17 DIAGNOSIS — R928 Other abnormal and inconclusive findings on diagnostic imaging of breast: Secondary | ICD-10-CM

## 2014-03-18 ENCOUNTER — Emergency Department (HOSPITAL_COMMUNITY)
Admission: EM | Admit: 2014-03-18 | Discharge: 2014-03-18 | Disposition: A | Discharge status: Home / Self Care | Payer: BC Managed Care – PPO | Attending: Emergency Medicine | Admitting: Emergency Medicine

## 2014-03-18 ENCOUNTER — Emergency Department (HOSPITAL_COMMUNITY): Payer: BC Managed Care – PPO

## 2014-03-18 ENCOUNTER — Encounter (HOSPITAL_COMMUNITY): Payer: Self-pay | Admitting: Emergency Medicine

## 2014-03-18 DIAGNOSIS — Z720 Tobacco use: Secondary | ICD-10-CM | POA: Diagnosis not present

## 2014-03-18 DIAGNOSIS — Z8719 Personal history of other diseases of the digestive system: Secondary | ICD-10-CM | POA: Insufficient documentation

## 2014-03-18 DIAGNOSIS — Z8739 Personal history of other diseases of the musculoskeletal system and connective tissue: Secondary | ICD-10-CM | POA: Insufficient documentation

## 2014-03-18 DIAGNOSIS — I10 Essential (primary) hypertension: Secondary | ICD-10-CM | POA: Diagnosis not present

## 2014-03-18 DIAGNOSIS — R112 Nausea with vomiting, unspecified: Secondary | ICD-10-CM | POA: Insufficient documentation

## 2014-03-18 DIAGNOSIS — Z79899 Other long term (current) drug therapy: Secondary | ICD-10-CM | POA: Diagnosis not present

## 2014-03-18 DIAGNOSIS — Z8619 Personal history of other infectious and parasitic diseases: Secondary | ICD-10-CM | POA: Insufficient documentation

## 2014-03-18 DIAGNOSIS — I2510 Atherosclerotic heart disease of native coronary artery without angina pectoris: Secondary | ICD-10-CM | POA: Insufficient documentation

## 2014-03-18 LAB — CBC WITH DIFFERENTIAL/PLATELET
Basophils Absolute: 0 10*3/uL (ref 0.0–0.1)
Basophils Relative: 0 % (ref 0–1)
EOS PCT: 0 % (ref 0–5)
Eosinophils Absolute: 0 10*3/uL (ref 0.0–0.7)
HCT: 39.8 % (ref 36.0–46.0)
HEMOGLOBIN: 14.1 g/dL (ref 12.0–15.0)
LYMPHS ABS: 3.4 10*3/uL (ref 0.7–4.0)
LYMPHS PCT: 31 % (ref 12–46)
MCH: 34.1 pg — ABNORMAL HIGH (ref 26.0–34.0)
MCHC: 35.4 g/dL (ref 30.0–36.0)
MCV: 96.4 fL (ref 78.0–100.0)
MONOS PCT: 8 % (ref 3–12)
Monocytes Absolute: 0.9 10*3/uL (ref 0.1–1.0)
Neutro Abs: 6.5 10*3/uL (ref 1.7–7.7)
Neutrophils Relative %: 61 % (ref 43–77)
Platelets: 175 10*3/uL (ref 150–400)
RBC: 4.13 MIL/uL (ref 3.87–5.11)
RDW: 13.4 % (ref 11.5–15.5)
WBC: 10.8 10*3/uL — AB (ref 4.0–10.5)

## 2014-03-18 LAB — COMPREHENSIVE METABOLIC PANEL
ALT: 76 U/L — ABNORMAL HIGH (ref 0–35)
AST: 128 U/L — ABNORMAL HIGH (ref 0–37)
Albumin: 3.2 g/dL — ABNORMAL LOW (ref 3.5–5.2)
Alkaline Phosphatase: 116 U/L (ref 39–117)
Anion gap: 12 (ref 5–15)
BUN: 10 mg/dL (ref 6–23)
CALCIUM: 9.7 mg/dL (ref 8.4–10.5)
CO2: 23 mEq/L (ref 19–32)
Chloride: 97 mEq/L (ref 96–112)
Creatinine, Ser: 0.67 mg/dL (ref 0.50–1.10)
GLUCOSE: 110 mg/dL — AB (ref 70–99)
Potassium: 3.5 mEq/L — ABNORMAL LOW (ref 3.7–5.3)
SODIUM: 132 meq/L — AB (ref 137–147)
Total Bilirubin: 1.6 mg/dL — ABNORMAL HIGH (ref 0.3–1.2)
Total Protein: 9.4 g/dL — ABNORMAL HIGH (ref 6.0–8.3)

## 2014-03-18 LAB — LIPASE, BLOOD: Lipase: 30 U/L (ref 11–59)

## 2014-03-18 LAB — PROTIME-INR
INR: 1.25 (ref 0.00–1.49)
PROTHROMBIN TIME: 15.8 s — AB (ref 11.6–15.2)

## 2014-03-18 MED ORDER — ONDANSETRON 4 MG PO TBDP: 4.0000 mg | ORAL_TABLET | Freq: Three times a day (TID) | ORAL | Status: DC | PRN

## 2014-03-18 MED ORDER — FAMOTIDINE IN NACL 20-0.9 MG/50ML-% IV SOLN
20.0000 mg | Freq: Once | INTRAVENOUS | Status: AC
  Administered 2014-03-18: 20 mg via INTRAVENOUS
  Filled 2014-03-18: qty 50

## 2014-03-18 MED ORDER — SODIUM CHLORIDE 0.9 % IV BOLUS (SEPSIS)
1000.0000 mL | Freq: Once | INTRAVENOUS | Status: AC
  Administered 2014-03-18: 1000 mL via INTRAVENOUS

## 2014-03-18 MED ORDER — ONDANSETRON HCL 4 MG/2ML IJ SOLN
4.0000 mg | Freq: Once | INTRAMUSCULAR | Status: AC
  Administered 2014-03-18: 4 mg via INTRAVENOUS
  Filled 2014-03-18: qty 2

## 2014-03-18 NOTE — ED Provider Notes (Signed)
CSN: [id number]     Arrival date & time 03/18/14  4098 History   First MD Initiated Contact with Patient 03/18/14 [phone]     Chief Complaint  Patient presents with  . Emesis     (Consider location/radiation/quality/duration/timing/severity/associated sxs/prior Treatment) HPI Patricia Aguirre is a 63 y.o. female with past medical history of hepatitis C, hypertension, coronary artery disease coming in with nausea and vomiting. Patient states this has gone on for 4 days. She has 4 episodes of emesis per day which are nonbloody. Nothing makes the symptoms better or worse. She denies any abdominal pain. She was supposed to get a scope performed after her diagnosis of hepatitis C, but this was canceled because she did not have transportation to get home. She denies any recent infections, but states she has hot and cold flashes throughout the 4 days. She's had no coughing chest pain shortness of breath. There is no dysuria hematuria or change in frequency. Patient has no further complaints.   10 Systems reviewed and are negative for acute change except as noted in the HPI.     Past Medical History  Diagnosis Date  . Coronary artery disease   . Hypertension   . H/O hiatal hernia   . Arthritis   . Hepatitis     Hep C; dx 1 week ago 10/2113   Past Surgical History  Procedure Laterality Date  . Colonoscopy    . Colonoscopy with propofol N/A 10/27/2013    Procedure: COLONOSCOPY WITH PROPOFOL;  Surgeon: Charolett Bumpers, MD;  Location: [facility];  Service: Endoscopy;  Laterality: N/A;   Family History  Problem Relation Age of Onset  . Diabetes Mellitus I Mother   . Coronary artery disease Father   . Heart disease Father    History  Substance Use Topics  . Smoking status: Current Some Day Smoker -- 0.25 packs/day for 20 years    Types: Cigarettes  . Smokeless tobacco: Never Used  . Alcohol Use: Yes     Comment: socially   OB History   Grav Para Term Preterm Abortions TAB SAB Ect Mult  Living                 Review of Systems    Allergies  Review of patient's allergies indicates no known allergies.  Home Medications   Prior to Admission medications   Medication Sig Start Date End Date Taking? Authorizing Provider  dextromethorphan-guaiFENesin (MUCINEX DM) 30-600 MG per 12 hr tablet Take 1 tablet by mouth 2 (two) times daily.   Yes Historical Provider, MD  fexofenadine (ALLEGRA) 180 MG tablet Take 180 mg by mouth daily.   Yes Historical Provider, MD  losartan-hydrochlorothiazide (HYZAAR) 100-25 MG per tablet Take 1 tablet by mouth every morning.  08/05/13  Yes Historical Provider, MD  omeprazole (PRILOSEC OTC) 20 MG tablet Take 20 mg by mouth daily as needed (acid reflux).    Yes Historical Provider, MD  Ledipasvir-Sofosbuvir (HARVONI) 90-400 MG TABS Take 1 tablet by mouth daily. 01/14/14   Gardiner Barefoot, MD   BP 147/60  Pulse 54  Temp(Src) 98.8 F (37.1 C) (Oral)  Resp 18  SpO2 99% Physical Exam  Nursing note and vitals reviewed. Constitutional: She is oriented to person, place, and time. She appears well-developed and well-nourished. No distress.  HENT:  Head: Normocephalic and atraumatic.  Nose: Nose normal.  Mouth/Throat: Oropharynx is clear and moist. No oropharyngeal exudate.  Eyes: Conjunctivae and EOM are normal. Pupils are equal, round,  and reactive to light. No scleral icterus.  Neck: Normal range of motion. Neck supple. No JVD present. No tracheal deviation present. No thyromegaly present.  Cardiovascular: Normal rate, regular rhythm and normal heart sounds.  Exam reveals no gallop and no friction rub.   No murmur heard. Pulmonary/Chest: Effort normal and breath sounds normal. No respiratory distress. She has no wheezes. She exhibits no tenderness.  Abdominal: Soft. Bowel sounds are normal. She exhibits no distension and no mass. There is no tenderness. There is no rebound and no guarding.  Musculoskeletal: Normal range of motion. She exhibits no  edema and no tenderness.  Lymphadenopathy:    She has no cervical adenopathy.  Neurological: She is alert and oriented to person, place, and time. No cranial nerve deficit. She exhibits normal muscle tone.  Skin: Skin is warm and dry. No rash noted. She is not diaphoretic. No erythema. No pallor.    ED Course  Procedures (including critical care time) Labs Review Labs Reviewed  CBC WITH DIFFERENTIAL - Abnormal; Notable for the following:    WBC 10.8 (*)    MCH 34.1 (*)    All other components within normal limits  COMPREHENSIVE METABOLIC PANEL - Abnormal; Notable for the following:    Sodium 132 (*)    Potassium 3.5 (*)    Glucose, Bld 110 (*)    Total Protein 9.4 (*)    Albumin 3.2 (*)    AST 128 (*)    ALT 76 (*)    Total Bilirubin 1.6 (*)    All other components within normal limits  PROTIME-INR - Abnormal; Notable for the following:    Prothrombin Time 15.8 (*)    All other components within normal limits  LIPASE, BLOOD  URINALYSIS, ROUTINE W REFLEX MICROSCOPIC    Imaging Review [place] Abdomen Limited  03/18/2014   CLINICAL DATA:  Right upper quadrant abdominal pain, nausea and vomiting.  EXAM: US ABDOMEN LIMITED - RIGHT UPPER QUADRANT  COMPARISON:  Abdominal ultrasound performed 12/01/2013  FINDINGS: Gallbladder:  No gallstones or wall thickening visualized. No sonographic Murphy sign noted.  Common bile duct:  Diameter: 0.5 cm, within normal limits in caliber.  Liver:  No focal lesion identified. Within normal limits in parenchymal echogenicity.  IMPRESSION: Unremarkable ultrasound of the right upper quadrant.   Electronically Signed   By: Roanna RaiderJeffery  Chang M.D.   On: 03/18/2014 04:49     EKG Interpretation None      MDM   Final diagnoses:  None     patient presents to the emergency department for nausea and vomiting. She was given 1 L of fluids, Zofran, famotidine for treatment. Labs are pending. Will obtain ultrasound to evaluate cirrhotic liver and gallbladder  pathology.   Ultrasound is unremarkable. She does have mild increase in her liver enzymes and total bilirubin which is consistent with her hepatitis. She is advised to follow up with her GI doctor for continued treatment. Upon repeat evaluation the patient's symptoms had resolved and she no longer feels nauseous. Her vital signs remain within her normal limits and she is safe for discharge.    Tomasita CrumbleAdeleke Aladdin Kollmann, MD 03/18/14 [phone]

## 2014-03-18 NOTE — ED Notes (Signed)
Pt informed again of need for urine sample. Unable to provide at this time.

## 2014-03-18 NOTE — ED Notes (Signed)
Per EMS, pt has been having n/v x 4 days. Pt states she has not eaten since this started. Pt has hx of liver disease.

## 2014-03-18 NOTE — Discharge Instructions (Signed)
Nausea and Vomiting Ms. Borror, you were seen today for nausea and vomiting. Your laboratory studies reveal abnormal liver enzymes. You need to followup with her GI doctor for continued treatment of your hepatitis. Continue to take Zofran at home for nausea. If any of her symptoms worsen come back to the emergency department for repeat evaluation. Thank you Nausea means you feel sick to your stomach. Throwing up (vomiting) is a reflex where stomach contents come out of your mouth. HOME CARE   Take medicine as told by your doctor.  Do not force yourself to eat. However, you do need to drink fluids.  If you feel like eating, eat a normal diet as told by your doctor.  Eat rice, wheat, potatoes, bread, lean meats, yogurt, fruits, and vegetables.  Avoid high-fat foods.  Drink enough fluids to keep your pee (urine) clear or pale yellow.  Ask your doctor how to replace body fluid losses (rehydrate). Signs of body fluid loss (dehydration) include:  Feeling very thirsty.  Dry lips and mouth.  Feeling dizzy.  Dark pee.  Peeing less than normal.  Feeling confused.  Fast breathing or heart rate. GET HELP RIGHT AWAY IF:   You have blood in your throw up.  You have black or bloody poop (stool).  You have a bad headache or stiff neck.  You feel confused.  You have bad belly (abdominal) pain.  You have chest pain or trouble breathing.  You do not pee at least once every 8 hours.  You have cold, clammy skin.  You keep throwing up after 24 to 48 hours.  You have a fever. MAKE SURE YOU:   Understand these instructions.  Will watch your condition.  Will get help right away if you are not doing well or get worse. Document Released: 11/14/2007 Document Revised: 08/20/2011 Document Reviewed: 10/27/2010 [facility] Patient Information 2015 [place] [place]. This information is not intended to replace advice given to you by your health care provider. Make sure you discuss any  questions you have with your health care provider.

## 2014-03-18 NOTE — ED Notes (Signed)
Bed: [id number] Expected date: 03/18/14 Expected time: 3:07 AM Means of arrival: Ambulance Comments: 63 yo F  N/V

## 2014-03-19 ENCOUNTER — Encounter (HOSPITAL_COMMUNITY): Payer: Self-pay | Admitting: Emergency Medicine

## 2014-03-19 ENCOUNTER — Emergency Department (HOSPITAL_COMMUNITY): Payer: BC Managed Care – PPO

## 2014-03-19 ENCOUNTER — Emergency Department (HOSPITAL_COMMUNITY)
Admission: EM | Admit: 2014-03-19 | Discharge: 2014-03-19 | Disposition: A | Discharge status: Home / Self Care | Payer: BC Managed Care – PPO | Attending: Emergency Medicine | Admitting: Emergency Medicine

## 2014-03-19 DIAGNOSIS — Z8719 Personal history of other diseases of the digestive system: Secondary | ICD-10-CM | POA: Insufficient documentation

## 2014-03-19 DIAGNOSIS — Z8619 Personal history of other infectious and parasitic diseases: Secondary | ICD-10-CM | POA: Diagnosis not present

## 2014-03-19 DIAGNOSIS — I10 Essential (primary) hypertension: Secondary | ICD-10-CM | POA: Diagnosis not present

## 2014-03-19 DIAGNOSIS — I2510 Atherosclerotic heart disease of native coronary artery without angina pectoris: Secondary | ICD-10-CM | POA: Insufficient documentation

## 2014-03-19 DIAGNOSIS — K5900 Constipation, unspecified: Secondary | ICD-10-CM | POA: Insufficient documentation

## 2014-03-19 DIAGNOSIS — R112 Nausea with vomiting, unspecified: Secondary | ICD-10-CM | POA: Insufficient documentation

## 2014-03-19 DIAGNOSIS — Z8739 Personal history of other diseases of the musculoskeletal system and connective tissue: Secondary | ICD-10-CM | POA: Insufficient documentation

## 2014-03-19 DIAGNOSIS — Z720 Tobacco use: Secondary | ICD-10-CM | POA: Diagnosis not present

## 2014-03-19 DIAGNOSIS — Z79899 Other long term (current) drug therapy: Secondary | ICD-10-CM | POA: Diagnosis not present

## 2014-03-19 DIAGNOSIS — R1110 Vomiting, unspecified: Secondary | ICD-10-CM | POA: Diagnosis present

## 2014-03-19 LAB — COMPREHENSIVE METABOLIC PANEL
ALT: 71 U/L — ABNORMAL HIGH (ref 0–35)
ANION GAP: 14 (ref 5–15)
AST: 122 U/L — ABNORMAL HIGH (ref 0–37)
Albumin: 3.1 g/dL — ABNORMAL LOW (ref 3.5–5.2)
Alkaline Phosphatase: 107 U/L (ref 39–117)
BUN: 12 mg/dL (ref 6–23)
CALCIUM: 9.4 mg/dL (ref 8.4–10.5)
CO2: 21 mEq/L (ref 19–32)
CREATININE: 0.8 mg/dL (ref 0.50–1.10)
Chloride: 97 mEq/L (ref 96–112)
GFR calc Af Amer: 90 mL/min — ABNORMAL LOW (ref >90)
GFR calc non Af Amer: 77 mL/min — ABNORMAL LOW (ref >90)
Glucose, Bld: 112 mg/dL — ABNORMAL HIGH (ref 70–99)
Potassium: 3.8 mEq/L (ref 3.7–5.3)
Sodium: 132 mEq/L — ABNORMAL LOW (ref 137–147)
TOTAL PROTEIN: 9 g/dL — AB (ref 6.0–8.3)
Total Bilirubin: 1.5 mg/dL — ABNORMAL HIGH (ref 0.3–1.2)

## 2014-03-19 LAB — CBC WITH DIFFERENTIAL/PLATELET
BASOS PCT: 0 % (ref 0–1)
Basophils Absolute: 0 10*3/uL (ref 0.0–0.1)
EOS ABS: 0 10*3/uL (ref 0.0–0.7)
Eosinophils Relative: 0 % (ref 0–5)
HEMATOCRIT: 39.6 % (ref 36.0–46.0)
Hemoglobin: 14.2 g/dL (ref 12.0–15.0)
Lymphocytes Relative: 34 % (ref 12–46)
Lymphs Abs: 3.3 10*3/uL (ref 0.7–4.0)
MCH: 34.6 pg — AB (ref 26.0–34.0)
MCHC: 35.9 g/dL (ref 30.0–36.0)
MCV: 96.6 fL (ref 78.0–100.0)
MONO ABS: 0.8 10*3/uL (ref 0.1–1.0)
MONOS PCT: 8 % (ref 3–12)
NEUTROS PCT: 58 % (ref 43–77)
Neutro Abs: 5.6 10*3/uL (ref 1.7–7.7)
Platelets: 176 10*3/uL (ref 150–400)
RBC: 4.1 MIL/uL (ref 3.87–5.11)
RDW: 13.4 % (ref 11.5–15.5)
WBC: 9.7 10*3/uL (ref 4.0–10.5)

## 2014-03-19 LAB — URINALYSIS, ROUTINE W REFLEX MICROSCOPIC
BILIRUBIN URINE: NEGATIVE
Glucose, UA: NEGATIVE mg/dL
Ketones, ur: NEGATIVE mg/dL
Leukocytes, UA: NEGATIVE
NITRITE: NEGATIVE
Protein, ur: NEGATIVE mg/dL
SPECIFIC GRAVITY, URINE: 1.015 (ref 1.005–1.030)
UROBILINOGEN UA: 1 mg/dL (ref 0.0–1.0)
pH: 7 (ref 5.0–8.0)

## 2014-03-19 LAB — LIPASE, BLOOD: LIPASE: 42 U/L (ref 11–59)

## 2014-03-19 LAB — URINE MICROSCOPIC-ADD ON

## 2014-03-19 MED ORDER — ONDANSETRON HCL 4 MG/2ML IJ SOLN
4.0000 mg | Freq: Once | INTRAMUSCULAR | Status: AC
  Administered 2014-03-19: 4 mg via INTRAVENOUS
  Filled 2014-03-19: qty 2

## 2014-03-19 MED ORDER — ONDANSETRON 4 MG PO TBDP: 4.0000 mg | ORAL_TABLET | Freq: Once | ORAL | Status: DC

## 2014-03-19 MED ORDER — PROMETHAZINE HCL 25 MG PO TABS
25.0000 mg | ORAL_TABLET | Freq: Once | ORAL | Status: AC
  Administered 2014-03-19: 25 mg via ORAL
  Filled 2014-03-19: qty 1

## 2014-03-19 MED ORDER — SODIUM CHLORIDE 0.9 % IV BOLUS (SEPSIS)
1000.0000 mL | Freq: Once | INTRAVENOUS | Status: AC
  Administered 2014-03-19: 1000 mL via INTRAVENOUS

## 2014-03-19 MED ORDER — PROMETHAZINE HCL 25 MG PO TABS: 25.0000 mg | ORAL_TABLET | Freq: Four times a day (QID) | ORAL | Status: DC | PRN

## 2014-03-19 NOTE — ED Notes (Signed)
Pt reports nausea decreased; "only a little nauseous now."

## 2014-03-19 NOTE — ED Notes (Addendum)
Previously pt pacing in room due to uncomfort with nausea. Pt able to lay in bed resting at present time. Pt reports decrease in nausea. Pt unable to void at present time; will reassess able to void.

## 2014-03-19 NOTE — ED Notes (Signed)
Pt reports nausea increasing; pt has not vomited. Per Ivin BootyJoshua PA give pt something light like applesause to eat.

## 2014-03-19 NOTE — Discharge Instructions (Signed)
Please read and follow all provided instructions.  Your diagnoses today include:  1. Non-intractable vomiting with nausea, vomiting of unspecified type     Tests performed today include:  Blood counts and electrolytes  Blood tests to check liver and kidney function  Blood tests to check pancreas function  Urine test to look for infection   X-ray of your abdomen - does not show any blockages  Vital signs. See below for your results today.   Medications prescribed:   Phenergan (promethazine) - for nausea and vomiting  Take any prescribed medications only as directed.  Home care instructions:   Follow any educational materials contained in this packet.  Follow-up instructions: Please follow-up with your primary care provider in the next 2 days for further evaluation of your symptoms.    Return instructions:  SEEK IMMEDIATE MEDICAL ATTENTION IF:  The pain does not go away or becomes severe   A temperature above 101F develops   Repeated vomiting occurs (multiple episodes)   The pain becomes localized to portions of the abdomen. The right side could possibly be appendicitis. In an adult, the left lower portion of the abdomen could be colitis or diverticulitis.   Blood is being passed in stools or vomit (bright red or black tarry stools)   You develop chest pain, difficulty breathing, dizziness or fainting, or become confused, poorly responsive, or inconsolable (young children)  If you have any other emergent concerns regarding your health  Additional Information: Abdominal (belly) pain can be caused by many things. Your caregiver performed an examination and possibly ordered blood/urine tests and imaging (CT scan, x-rays, ultrasound). Many cases can be observed and treated at home after initial evaluation in the emergency department. Even though you are being discharged home, abdominal pain can be unpredictable. Therefore, you need a repeated exam if your pain does not  resolve, returns, or worsens. Most patients with abdominal pain don't have to be admitted to the hospital or have surgery, but serious problems like appendicitis and gallbladder attacks can start out as nonspecific pain. Many abdominal conditions cannot be diagnosed in one visit, so follow-up evaluations are very important.  Your vital signs today were: BP 129/82   Pulse 45   Temp(Src) 98.4 F (36.9 C) (Oral)   Resp 16   SpO2 99% If your blood pressure (bp) was elevated above 135/85 this visit, please have this repeated by your doctor within one month. --------------

## 2014-03-19 NOTE — ED Notes (Addendum)
Pt denies pain but reports "uncomfortable" due to nausea/vomiting the past 5 days. Pt reports was given zofran when discharged previously and "does not work." Pt reports vomited once prior to xray.

## 2014-03-19 NOTE — ED Provider Notes (Signed)
CSN: [id number]     Arrival date & time 03/19/14  0609 History   First MD Initiated Contact with Patient 03/19/14 [phone]     Chief Complaint  Patient presents with  . Emesis     (Consider location/radiation/quality/duration/timing/severity/associated sxs/prior Treatment) HPI Comments: Patient with history of hepatitis C presents with complaint of nausea and vomiting that has been ongoing for approximately 5 days. Patient was seen in emergency department yesterday for the same and had unremarkable lab workup and abdominal ultrasound. Her symptoms were improved in emergency department and she was discharged home. She's been taking Zofran however this did not help and she presumes having severe vomiting and nausea. She has generalized, nonfocal abdominal discomfort which she described as nausea and not really pain. Patient states that she has not had a bowel movement in 4 days and states that she is constipated. There is no previous history of abdominal surgeries. No past history of inflammatory bowel disease. No urinary symptoms. Unremarkable colonoscopy 05/15. Patient was scheduled for upper endoscopy however this was canceled and not performed.  Patient is a 63 y.o. female presenting with vomiting. The history is provided by the patient and medical records.  Emesis Associated symptoms: no abdominal pain, no diarrhea, no headaches, no myalgias and no sore throat     Past Medical History  Diagnosis Date  . Coronary artery disease   . Hypertension   . H/O hiatal hernia   . Arthritis   . Hepatitis     Hep C; dx 1 week ago 10/2113   Past Surgical History  Procedure Laterality Date  . Colonoscopy    . Colonoscopy with propofol N/A 10/27/2013    Procedure: COLONOSCOPY WITH PROPOFOL;  Surgeon: Charolett BumpersMartin K Johnson, MD;  Location: [facility];  Service: Endoscopy;  Laterality: N/A;   Family History  Problem Relation Age of Onset  . Diabetes Mellitus I Mother   . Coronary artery disease Father   .  Heart disease Father    History  Substance Use Topics  . Smoking status: Current Some Day Smoker -- 0.25 packs/day for 20 years    Types: Cigarettes  . Smokeless tobacco: Never Used  . Alcohol Use: Yes     Comment: socially   OB History   Grav Para Term Preterm Abortions TAB SAB Ect Mult Living                 Review of Systems  Constitutional: Negative for fever.  HENT: Negative for rhinorrhea and sore throat.   Eyes: Negative for redness.  Respiratory: Negative for cough.   Cardiovascular: Negative for chest pain.  Gastrointestinal: Positive for nausea, vomiting and constipation. Negative for abdominal pain and diarrhea.  Genitourinary: Negative for dysuria and hematuria.  Musculoskeletal: Negative for myalgias.  Skin: Negative for rash.  Neurological: Negative for headaches.    Allergies  Review of patient's allergies indicates no known allergies.  Home Medications   Prior to Admission medications   Medication Sig Start Date End Date Taking? Authorizing Provider  dextromethorphan-guaiFENesin (MUCINEX DM) 30-600 MG per 12 hr tablet Take 1 tablet by mouth 2 (two) times daily.    Historical Provider, MD  fexofenadine (ALLEGRA) 180 MG tablet Take 180 mg by mouth daily.    Historical Provider, MD  Ledipasvir-Sofosbuvir (HARVONI) 90-400 MG TABS Take 1 tablet by mouth daily. 01/14/14   Gardiner Barefootobert W Comer, MD  losartan-hydrochlorothiazide [facility](HYZAAR) 100-25 MG per tablet Take 1 tablet by mouth every morning.  08/05/13   Historical Provider, MD  omeprazole (PRILOSEC OTC) 20 MG tablet Take 20 mg by mouth daily as needed (acid reflux).     Historical Provider, MD  ondansetron (ZOFRAN-ODT) 4 MG disintegrating tablet Take 1 tablet (4 mg total) by mouth every 8 (eight) hours as needed for nausea or vomiting. 03/18/14   Tomasita CrumbleAdeleke Oni, MD   BP 144/122  Pulse 44  Temp(Src) 98.3 F (36.8 C) (Oral)  Resp 22  SpO2 99%  Physical Exam  Nursing note and vitals reviewed. Constitutional: She appears  well-developed and well-nourished.  HENT:  Head: Normocephalic and atraumatic.  Eyes: Conjunctivae are normal. Right eye exhibits no discharge. Left eye exhibits no discharge.  Neck: Normal range of motion. Neck supple.  Cardiovascular: Normal rate, regular rhythm and normal heart sounds.   Pulmonary/Chest: Effort normal and breath sounds normal.  Abdominal: Soft. Bowel sounds are decreased. There is no tenderness. There is no rigidity, no rebound, no guarding, no CVA tenderness, no tenderness at McBurney's point and negative Murphy's sign.  Musculoskeletal: She exhibits no edema and no tenderness.  Neurological: She is alert.  Skin: Skin is warm and dry.  Psychiatric: She has a normal mood and affect.    ED Course  Procedures (including critical care time) Labs Review Labs Reviewed  CBC WITH DIFFERENTIAL - Abnormal; Notable for the following:    MCH 34.6 (*)    All other components within normal limits  COMPREHENSIVE METABOLIC PANEL - Abnormal; Notable for the following:    Sodium 132 (*)    Glucose, Bld 112 (*)    Total Protein 9.0 (*)    Albumin 3.1 (*)    AST 122 (*)    ALT 71 (*)    Total Bilirubin 1.5 (*)    GFR calc non Af Amer 77 (*)    GFR calc Af Amer 90 (*)    All other components within normal limits  URINALYSIS, ROUTINE W REFLEX MICROSCOPIC - Abnormal; Notable for the following:    Hgb urine dipstick TRACE (*)    All other components within normal limits  LIPASE, BLOOD  URINE MICROSCOPIC-ADD ON    Imaging Review [place] Abdomen Limited  03/18/2014   CLINICAL DATA:  Right upper quadrant abdominal pain, nausea and vomiting.  EXAM: US ABDOMEN LIMITED - RIGHT UPPER QUADRANT  COMPARISON:  Abdominal ultrasound performed 12/01/2013  FINDINGS: Gallbladder:  No gallstones or wall thickening visualized. No sonographic Murphy sign noted.  Common bile duct:  Diameter: 0.5 cm, within normal limits in caliber.  Liver:  No focal lesion identified. Within normal limits in parenchymal  echogenicity.  IMPRESSION: Unremarkable ultrasound of the right upper quadrant.   Electronically Signed   By: Roanna RaiderJeffery  Chang M.D.   On: 03/18/2014 04:49     EKG Interpretation None      6:37 AM Patient seen and examined. Work-up initiated. Medications ordered. Records reviewed.   Vital signs reviewed and are as follows: BP 144/122  Pulse 44  Temp(Src) 98.3 F (36.8 C) (Oral)  Resp 22  SpO2 99%  Patient discussed with Dr. Fayrene FearingJames. We reviewed patient history and findings. Agrees with plan.   Patient was given additional dose IV antiemetic and then oral antiemetic. She did not vomit. She tolerated oral fluids and a small amount of applesauce.  We discussed all lab findings. We discussed the etiology of her nausea and vomiting is unclear however she does not have enough findings or physical exam findings today which would indicate the need for hospitalization. She is willing to be discharged  to continue clear liquid diet and oral antiemetics. We discussed advancing her diet slowly. Encouraged patient to followup with Dr. Nehemiah Settle in the next several days for recheck. We discussed that nausea, vomiting as well as abdominal pain can be difficult to predict and if she feels worse she needs to return to the hospital.  The patient was urged to return to the Emergency Department immediately with worsening of current symptoms, worsening abdominal pain, persistent vomiting, blood noted in stools, fever, or any other concerns. The patient verbalized understanding.     MDM   Final diagnoses:  Non-intractable vomiting with nausea, vomiting of unspecified type   Patient with nausea and vomiting. No significant abdominal pain. This is her second emergency department visit in the past 2 days for the same symptoms. She was again treated with fluids and antiemetics with good results. Her labs are unchanged from previous. She had a abdominal ultrasound yesterday which was unremarkable. Today we checked a  acute abdominal series which does not demonstrate obstruction. No indications for hospitalization noted today. Patient spent several hours in the emergency department being observed during which she did not vomit. She was transitioned to oral medications with good results. Will discharge to home with continued oral medication and PCP followup. Appropriate return instructions discussed with patient at bedside and she seems reliable to return if worsening.  No dangerous or life-threatening conditions suspected or identified by history, physical exam, and by work-up. No indications for hospitalization identified.     Renne Crigler, PA-C 03/19/14 [phone]

## 2014-03-19 NOTE — ED Notes (Signed)
Pt states she has nausea and vomiting and was seen for same her yesterday   Pt states she was told if her sxs did not improve to come back  Pt states the po medication she was given did not work  Pt states last vomited around 7pm

## 2014-03-19 NOTE — ED Notes (Signed)
Pt drinking ice water at present time. Pt in NAD.

## 2014-03-19 NOTE — ED Notes (Addendum)
Pt encouraged to void; pt states unable to void at present time. Mariama NT reports will attempt to collect urine in 10 minutes.

## 2014-03-26 ENCOUNTER — Other Ambulatory Visit: Payer: Self-pay | Admitting: Obstetrics and Gynecology

## 2014-03-26 ENCOUNTER — Ambulatory Visit
Admission: RE | Admit: 2014-03-26 | Discharge: 2014-03-26 | Disposition: A | Discharge status: Home / Self Care | Payer: BC Managed Care – PPO | Source: Ambulatory Visit | Attending: Obstetrics and Gynecology | Admitting: Obstetrics and Gynecology

## 2014-03-26 DIAGNOSIS — R928 Other abnormal and inconclusive findings on diagnostic imaging of breast: Secondary | ICD-10-CM

## 2014-03-26 DIAGNOSIS — R921 Mammographic calcification found on diagnostic imaging of breast: Secondary | ICD-10-CM

## 2014-03-26 NOTE — ED Provider Notes (Signed)
Medical screening examination/treatment/procedure(s) were performed by non-physician practitioner and as supervising physician I was immediately available for consultation/collaboration.   EKG Interpretation None        Rolland PorterMark Karlton Maya, MD 03/26/14 [phone]

## 2014-04-06 ENCOUNTER — Ambulatory Visit
Admission: RE | Admit: 2014-04-06 | Discharge: 2014-04-06 | Disposition: A | Discharge status: Home / Self Care | Payer: BC Managed Care – PPO | Source: Ambulatory Visit | Attending: Obstetrics and Gynecology | Admitting: Obstetrics and Gynecology

## 2014-04-06 ENCOUNTER — Telehealth: Payer: Self-pay | Admitting: Pharmacist

## 2014-04-06 DIAGNOSIS — R921 Mammographic calcification found on diagnostic imaging of breast: Secondary | ICD-10-CM

## 2014-04-06 NOTE — Telephone Encounter (Signed)
[facility]voni approval [facility] Informed [facility] of approval. Provided Ms. Ordway's address and phone number to [facility]. She will be receiving a call from [facility] to confirm before drug is shipped. [facility] phone # [phone] if she needs to contact them. Left msg for pt to call office.

## 2014-04-06 NOTE — Telephone Encounter (Signed)
That is great!  Good work, I know that was a lot of effort.  Thanks, Rob

## 2014-04-07 ENCOUNTER — Other Ambulatory Visit: Payer: Self-pay | Admitting: Gastroenterology

## 2014-04-07 NOTE — Telephone Encounter (Signed)
Patient aware of approval, and she will call [facility] to arrange delivery

## 2014-04-08 ENCOUNTER — Telehealth: Payer: Self-pay | Admitting: *Deleted

## 2014-04-08 ENCOUNTER — Encounter (HOSPITAL_COMMUNITY): Payer: Self-pay | Admitting: Pharmacy Technician

## 2014-04-08 NOTE — Telephone Encounter (Signed)
Marcelino DusterMichelle, pharmacist notified patient that the Hassell HalimHarv[facility] Patient was given phone number [phone].

## 2014-04-09 ENCOUNTER — Encounter (HOSPITAL_COMMUNITY): Payer: Self-pay | Admitting: *Deleted

## 2014-04-12 ENCOUNTER — Ambulatory Visit (HOSPITAL_COMMUNITY)
Admission: RE | Admit: 2014-04-12 | Discharge: 2014-04-12 | Disposition: A | Discharge status: Home / Self Care | Payer: BC Managed Care – PPO | Source: Ambulatory Visit | Attending: Gastroenterology | Admitting: Gastroenterology

## 2014-04-12 ENCOUNTER — Encounter (HOSPITAL_COMMUNITY): Payer: Self-pay | Admitting: *Deleted

## 2014-04-12 ENCOUNTER — Ambulatory Visit (HOSPITAL_COMMUNITY): Payer: BC Managed Care – PPO | Admitting: Anesthesiology

## 2014-04-12 ENCOUNTER — Encounter (HOSPITAL_COMMUNITY)
Admission: RE | Disposition: A | Discharge status: Home / Self Care | Payer: Self-pay | Source: Ambulatory Visit | Attending: Gastroenterology

## 2014-04-12 DIAGNOSIS — F17210 Nicotine dependence, cigarettes, uncomplicated: Secondary | ICD-10-CM | POA: Diagnosis not present

## 2014-04-12 DIAGNOSIS — I10 Essential (primary) hypertension: Secondary | ICD-10-CM | POA: Diagnosis not present

## 2014-04-12 DIAGNOSIS — B182 Chronic viral hepatitis C: Secondary | ICD-10-CM | POA: Insufficient documentation

## 2014-04-12 DIAGNOSIS — I2510 Atherosclerotic heart disease of native coronary artery without angina pectoris: Secondary | ICD-10-CM | POA: Insufficient documentation

## 2014-04-12 DIAGNOSIS — K7460 Unspecified cirrhosis of liver: Secondary | ICD-10-CM | POA: Insufficient documentation

## 2014-04-12 DIAGNOSIS — K449 Diaphragmatic hernia without obstruction or gangrene: Secondary | ICD-10-CM | POA: Diagnosis not present

## 2014-04-12 DIAGNOSIS — K269 Duodenal ulcer, unspecified as acute or chronic, without hemorrhage or perforation: Secondary | ICD-10-CM | POA: Diagnosis not present

## 2014-04-12 HISTORY — PX: ESOPHAGOGASTRODUODENOSCOPY (EGD) WITH PROPOFOL: SHX5813

## 2014-04-12 SURGERY — ESOPHAGOGASTRODUODENOSCOPY (EGD) WITH PROPOFOL
Anesthesia: Monitor Anesthesia Care

## 2014-04-12 MED ORDER — PROPOFOL 10 MG/ML IV BOLUS
INTRAVENOUS | Status: AC
  Filled 2014-04-12: qty 20

## 2014-04-12 MED ORDER — LACTATED RINGERS IV SOLN
INTRAVENOUS | Status: DC
  Administered 2014-04-12: 1000 mL via INTRAVENOUS

## 2014-04-12 MED ORDER — FENTANYL CITRATE 0.05 MG/ML IJ SOLN
INTRAMUSCULAR | Status: AC
  Filled 2014-04-12: qty 2

## 2014-04-12 MED ORDER — LIDOCAINE HCL (CARDIAC) 20 MG/ML IV SOLN
INTRAVENOUS | Status: AC
  Filled 2014-04-12: qty 5

## 2014-04-12 MED ORDER — MIDAZOLAM HCL 2 MG/2ML IJ SOLN
INTRAMUSCULAR | Status: AC
  Filled 2014-04-12: qty 2

## 2014-04-12 MED ORDER — SODIUM CHLORIDE 0.9 % IV SOLN: INTRAVENOUS | Status: DC

## 2014-04-12 MED ORDER — LIDOCAINE HCL (CARDIAC) 20 MG/ML IV SOLN
INTRAVENOUS | Status: DC | PRN
  Administered 2014-04-12 (×2): 50 mg via INTRAVENOUS

## 2014-04-12 MED ORDER — PROPOFOL INFUSION 10 MG/ML OPTIME
INTRAVENOUS | Status: DC | PRN
  Administered 2014-04-12: 200 ug/kg/min via INTRAVENOUS

## 2014-04-12 MED ORDER — PROPOFOL 10 MG/ML IV BOLUS
INTRAVENOUS | Status: DC | PRN
  Administered 2014-04-12: 40 mg via INTRAVENOUS
  Administered 2014-04-12: 20 mg via INTRAVENOUS
  Administered 2014-04-12 (×4): 30 mg via INTRAVENOUS

## 2014-04-12 SURGICAL SUPPLY — 15 items

## 2014-04-12 NOTE — Transfer of Care (Signed)
Immediate Anesthesia Transfer of Care Note  Patient: Patricia RamusJean C Aguirre  Procedure(s) Performed: Procedure(s): ESOPHAGOGASTRODUODENOSCOPY (EGD) WITH PROPOFOL (N/A)  Patient Location: PACU  Anesthesia Type:MAC  Level of Consciousness: Patient easily awoken, sedated, comfortable, cooperative, following commands, responds to stimulation.   Airway & Oxygen Therapy: Patient spontaneously breathing, ventilating well, oxygen via simple oxygen mask.  Post-op Assessment: Report given to PACU RN, vital signs reviewed and stable, moving all extremities.   Post vital signs: Reviewed and stable.  Complications: No apparent anesthesia complications  Anesthesia Transfer of Care Note  Patient: Patricia RamusJean C Carnathan  Procedure(s) Performed: Procedure(s): ESOPHAGOGASTRODUODENOSCOPY (EGD) WITH PROPOFOL (N/A)  Patient Location: PACU and Endoscopy Unit recovery  Anesthesia Type:MAC  Level of Consciousness: sedated, patient cooperative and responds to stimulation  Airway & Oxygen Therapy: Patient Spontanous Breathing and Patient connected to nasal cannula oxygen  Post-op Assessment: Report given to PACU RN, Post -op Vital signs reviewed and stable and Patient moving all extremities X 4  Post vital signs: Reviewed and stable  Complications: No apparent anesthesia complications

## 2014-04-12 NOTE — Discharge Instructions (Signed)
Esophagogastroduodenoscopy °Care After °Refer to this sheet in the next few weeks. These instructions provide you with information on caring for yourself after your procedure. Your caregiver may also give you more specific instructions. Your treatment has been planned according to current medical practices, but problems sometimes occur. Call your caregiver if you have any problems or questions after your procedure.  °HOME CARE INSTRUCTIONS °· Do not eat or drink anything until the numbing medicine (local anesthetic) has worn off and your gag reflex has returned. You will know that the local anesthetic has worn off when you can swallow comfortably. °· Do not drive for 12 hours after the procedure or as directed by your caregiver. °· Only take medicines as directed by your caregiver. °SEEK MEDICAL CARE IF:  °· You cannot stop coughing. °· You are not urinating at all or less than usual. °SEEK IMMEDIATE MEDICAL CARE IF: °· You have difficulty swallowing. °· You cannot eat or drink. °· You have worsening throat or chest pain. °· You have dizziness, lightheadedness, or you faint. °· You have nausea or vomiting. °· You have chills. °· You have a fever. °· You have severe abdominal pain. °· You have black, tarry, or bloody stools. °Document Released: 05/14/2012 Document Reviewed: 05/14/2012 °ExitCare® Patient Information ©2015 [employer]. This information is not intended to replace advice given to you by your health care provider. Make sure you discuss any questions you have with your health care provider. ° °

## 2014-04-12 NOTE — Op Note (Signed)
Procedure: Esophagogastroduodenoscopy to screen for esophagogastric varices. Chronic hepatitis C cirrhosis. Genotype 1B.  Endoscopist: Danise EdgeMartin Bazil Dhanani  Premedication: Propofol administered by anesthesia  Procedure: The patient was placed in the left lateral decubitus position. The Pentax gastroscope was passed through the posterior hypopharynx into the proximal esophagus without difficulty. The hypopharynx, larynx, and vocal cords appeared normal.  Esophagoscopy: The proximal, mid, and lower segments of the esophageal mucosa appeared normal. There were no esophageal varices. The squamocolumnar junction was noted at 40 cm from the incisor teeth. There was no endoscopic evidence for the presence of Barrett's esophagus.  Gastroscopy: Retroflex view of the gastric cardia and fundus was normal. The gastric body, antrum, and pylorus appeared normal. Random biopsies were performed to look for H. Pylori gastritis. No gastric varices were present.  Duodenoscopy: There was a duodenal bulb ulcer in the mid duodenal bulb on the posterior aspect. The ulcer was 4 mm in diameter. The ulcer base was exudative and there were no signs to suggest risk duodenal ulcer bleeding. The descending duodenum appeared normal.  Assessment:  #1. No esophagogastric varices noted  #2. Needed duodenal bulb ulcer at low risk for bleeding  #3. Gastric biopsies to screen for H. Pylori gastritis pending  Recommendation: Start taking omeprazole 20 mg before breakfast each morning for 6 weeks.

## 2014-04-12 NOTE — Anesthesia Postprocedure Evaluation (Signed)
  Anesthesia Post-op Note  Patient: Patricia Aguirre  Procedure(s) Performed: Procedure(s): ESOPHAGOGASTRODUODENOSCOPY (EGD) WITH PROPOFOL (N/A)  Patient Location: PACU  Anesthesia Type: MAC  Level of Consciousness: awake and alert   Airway and Oxygen Therapy: Patient Spontanous Breathing  Post-op Pain: none  Post-op Assessment: Post-op Vital signs reviewed, Patient's Cardiovascular Status Stable and Respiratory Function Stable  Post-op Vital Signs: Reviewed  Filed Vitals:   04/12/14 1515  BP: 106/66  Pulse: 87  Temp: 36.7 C  Resp: 20    Complications: No apparent anesthesia complications

## 2014-04-12 NOTE — H&P (Signed)
  Problem: Hepatitis C cirrhosis. Genotype 1B. Liver elastography score F4. Normal screening colonoscopy performed on 10/27/2013  History: The patient is a 63 year old female born 12/26/1950. She has chronic hepatitis C cirrhosis, genotype 1B. In May 2015, abdominal ultrasound was normal except for mild hepatomegaly without splenomegaly.  The patient is scheduled to undergo screening esophagogastroduodenoscopy to look for esophagogastric varices  Past medical history: Seasonal allergies. Hypertension. Diastolic dysfunction by cardiac echo. Left ventricular ejection fraction 45% in 2013. Septoplasty.  Exam: The patient is alert and lying comfortably on the endoscopy stretcher. Abdomen is soft and nontender to palpation. Lungs are clear to auscultation. Cardiac exam reveals a regular rhythm.  Plan: Proceed with screening esophagogastroduodenoscopy

## 2014-04-12 NOTE — Anesthesia Preprocedure Evaluation (Signed)
Anesthesia Evaluation  Patient identified by MRN, date of birth, ID band Patient awake    Reviewed: Allergy & Precautions, H&P , NPO status , Patient's Chart, lab work & pertinent test results  Airway Mallampati: II  TM Distance: >3 FB Neck ROM: Full    Dental no notable dental hx. (+) Teeth Intact, Dental Advisory Given   Pulmonary Current Smoker,  breath sounds clear to auscultation  Pulmonary exam normal       Cardiovascular hypertension, Pt. on medications + CAD Rhythm:Regular Rate:Normal     Neuro/Psych negative neurological ROS  negative psych ROS   GI/Hepatic hiatal hernia, (+) Hepatitis -, C  Endo/Other  negative endocrine ROS  Renal/GU negative Renal ROS  negative genitourinary   Musculoskeletal   Abdominal   Peds  Hematology negative hematology ROS (+)   Anesthesia Other Findings   Reproductive/Obstetrics negative OB ROS                             Anesthesia Physical Anesthesia Plan  ASA: III  Anesthesia Plan: MAC   Post-op Pain Management:    Induction: Intravenous  Airway Management Planned: Nasal Cannula  Additional Equipment:   Intra-op Plan:   Post-operative Plan:   Informed Consent: I have reviewed the patients History and Physical, chart, labs and discussed the procedure including the risks, benefits and alternatives for the proposed anesthesia with the patient or authorized representative who has indicated his/her understanding and acceptance.   Dental advisory given  Plan Discussed with: CRNA  Anesthesia Plan Comments:         Anesthesia Quick Evaluation

## 2014-04-13 ENCOUNTER — Encounter (HOSPITAL_COMMUNITY): Payer: Self-pay | Admitting: Gastroenterology

## 2014-04-13 NOTE — Progress Notes (Signed)
Called pt today to tell her on how to take Harvoni and prilosec. She is to take it at the same time on an empty stomach.

## 2014-04-16 ENCOUNTER — Encounter (HOSPITAL_COMMUNITY): Payer: Self-pay | Admitting: Gastroenterology

## 2014-05-10 ENCOUNTER — Other Ambulatory Visit (INDEPENDENT_AMBULATORY_CARE_PROVIDER_SITE_OTHER): Payer: Self-pay | Admitting: General Surgery

## 2014-05-10 DIAGNOSIS — R928 Other abnormal and inconclusive findings on diagnostic imaging of breast: Secondary | ICD-10-CM

## 2014-05-13 ENCOUNTER — Encounter: Payer: Self-pay | Admitting: Internal Medicine

## 2014-05-13 ENCOUNTER — Ambulatory Visit (INDEPENDENT_AMBULATORY_CARE_PROVIDER_SITE_OTHER): Payer: BC Managed Care – PPO | Admitting: Internal Medicine

## 2014-05-13 VITALS — BP 149/91 | HR 74 | Temp 97.9°F | Ht 66.0 in | Wt 164.0 lb

## 2014-05-13 DIAGNOSIS — B182 Chronic viral hepatitis C: Secondary | ICD-10-CM

## 2014-05-13 NOTE — Progress Notes (Signed)
   Subjective:    Patient ID: Patricia Aguirre, female    DOB: 08/27/1950, 63 y.o.   MRN: [id number]  HPI Here for follow up of HCV.  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  Cirrhosis and recently had EGD.  Now on her second month of Harvoni.  No issues.     Review of Systems  Constitutional: Negative for fatigue.  Gastrointestinal: Negative for nausea and diarrhea.  Skin: Negative for rash.  Neurological: Negative for headaches.       Objective:   Physical Exam  Constitutional: She appears well-developed and well-nourished. No distress.  Eyes: No scleral icterus.  Cardiovascular: Normal rate, regular rhythm and normal heart sounds.   No murmur heard. Pulmonary/Chest: Effort normal and breath sounds normal. No respiratory distress.  Skin: No rash noted.          Assessment & Plan:

## 2014-05-13 NOTE — Assessment & Plan Note (Signed)
Doing well.  Will get her EVR labs today and fu after finishing at the end of January

## 2014-05-14 LAB — COMPLETE METABOLIC PANEL WITH GFR
ALBUMIN: 3.5 g/dL (ref 3.5–5.2)
ALK PHOS: 102 U/L (ref 39–117)
ALT: 18 U/L (ref 0–35)
AST: 33 U/L (ref 0–37)
BUN: 12 mg/dL (ref 6–23)
CO2: 24 mEq/L (ref 19–32)
Calcium: 9.7 mg/dL (ref 8.4–10.5)
Chloride: 105 mEq/L (ref 96–112)
Creat: 0.71 mg/dL (ref 0.50–1.10)
GFR, Est African American: >89 mL/min
GFR, Est Non African American: >89 mL/min
Glucose, Bld: 77 mg/dL (ref 70–99)
POTASSIUM: 3.6 meq/L (ref 3.5–5.3)
Sodium: 140 mEq/L (ref 135–145)
TOTAL PROTEIN: 8.2 g/dL (ref 6.0–8.3)
Total Bilirubin: 0.8 mg/dL (ref 0.2–1.2)

## 2014-05-14 LAB — CBC WITH DIFFERENTIAL/PLATELET
BASOS ABS: 0 10*3/uL (ref 0.0–0.1)
Basophils Relative: 0 % (ref 0–1)
Eosinophils Absolute: 0.2 10*3/uL (ref 0.0–0.7)
Eosinophils Relative: 2 % (ref 0–5)
HEMATOCRIT: 41.6 % (ref 36.0–46.0)
Hemoglobin: 13.9 g/dL (ref 12.0–15.0)
Lymphocytes Relative: 54 % — ABNORMAL HIGH (ref 12–46)
Lymphs Abs: 4.6 10*3/uL — ABNORMAL HIGH (ref 0.7–4.0)
MCH: 32.8 pg (ref 26.0–34.0)
MCHC: 33.4 g/dL (ref 30.0–36.0)
MCV: 98.1 fL (ref 78.0–100.0)
MONOS PCT: 10 % (ref 3–12)
MPV: 11.3 fL (ref 9.4–12.4)
Monocytes Absolute: 0.9 10*3/uL (ref 0.1–1.0)
NEUTROS PCT: 34 % — AB (ref 43–77)
Neutro Abs: 2.9 10*3/uL (ref 1.7–7.7)
Platelets: 172 10*3/uL (ref 150–400)
RBC: 4.24 MIL/uL (ref 3.87–5.11)
RDW: 13.2 % (ref 11.5–15.5)
WBC: 8.5 10*3/uL (ref 4.0–10.5)

## 2014-05-17 LAB — HEPATITIS C RNA QUANTITATIVE
HCV Quantitative Log: <1.18 {Log} (ref <1.18)
HCV Quantitative: <15 IU/mL (ref <15)

## 2014-05-25 ENCOUNTER — Encounter (HOSPITAL_BASED_OUTPATIENT_CLINIC_OR_DEPARTMENT_OTHER): Payer: Self-pay | Admitting: *Deleted

## 2014-05-25 NOTE — Progress Notes (Signed)
Pt being tx for hep c Quit smoking and drinking alcohol Last ekg-12/14-still good

## 2014-05-26 ENCOUNTER — Ambulatory Visit
Admission: RE | Admit: 2014-05-26 | Discharge: 2014-05-26 | Disposition: A | Discharge status: Home / Self Care | Payer: BC Managed Care – PPO | Source: Ambulatory Visit | Attending: General Surgery | Admitting: General Surgery

## 2014-05-27 ENCOUNTER — Encounter (HOSPITAL_BASED_OUTPATIENT_CLINIC_OR_DEPARTMENT_OTHER): Payer: Self-pay | Admitting: Anesthesiology

## 2014-05-27 ENCOUNTER — Encounter (HOSPITAL_BASED_OUTPATIENT_CLINIC_OR_DEPARTMENT_OTHER)
Admission: RE | Disposition: A | Discharge status: Home / Self Care | Payer: Self-pay | Source: Ambulatory Visit | Attending: General Surgery

## 2014-05-27 ENCOUNTER — Ambulatory Visit (HOSPITAL_BASED_OUTPATIENT_CLINIC_OR_DEPARTMENT_OTHER): Payer: BC Managed Care – PPO | Admitting: Anesthesiology

## 2014-05-27 ENCOUNTER — Ambulatory Visit (HOSPITAL_BASED_OUTPATIENT_CLINIC_OR_DEPARTMENT_OTHER)
Admission: RE | Admit: 2014-05-27 | Discharge: 2014-05-27 | Disposition: A | Discharge status: Home / Self Care | Payer: BC Managed Care – PPO | Source: Ambulatory Visit | Attending: General Surgery | Admitting: General Surgery

## 2014-05-27 ENCOUNTER — Ambulatory Visit
Admission: RE | Admit: 2014-05-27 | Discharge: 2014-05-27 | Disposition: A | Discharge status: Home / Self Care | Payer: BC Managed Care – PPO | Source: Ambulatory Visit | Attending: General Surgery | Admitting: General Surgery

## 2014-05-27 DIAGNOSIS — R928 Other abnormal and inconclusive findings on diagnostic imaging of breast: Secondary | ICD-10-CM

## 2014-05-27 DIAGNOSIS — Z8249 Family history of ischemic heart disease and other diseases of the circulatory system: Secondary | ICD-10-CM | POA: Diagnosis not present

## 2014-05-27 DIAGNOSIS — K219 Gastro-esophageal reflux disease without esophagitis: Secondary | ICD-10-CM | POA: Diagnosis not present

## 2014-05-27 DIAGNOSIS — B1920 Unspecified viral hepatitis C without hepatic coma: Secondary | ICD-10-CM | POA: Insufficient documentation

## 2014-05-27 DIAGNOSIS — M1990 Unspecified osteoarthritis, unspecified site: Secondary | ICD-10-CM | POA: Diagnosis not present

## 2014-05-27 DIAGNOSIS — Z87891 Personal history of nicotine dependence: Secondary | ICD-10-CM | POA: Diagnosis not present

## 2014-05-27 DIAGNOSIS — I2510 Atherosclerotic heart disease of native coronary artery without angina pectoris: Secondary | ICD-10-CM | POA: Insufficient documentation

## 2014-05-27 DIAGNOSIS — K449 Diaphragmatic hernia without obstruction or gangrene: Secondary | ICD-10-CM | POA: Insufficient documentation

## 2014-05-27 DIAGNOSIS — I10 Essential (primary) hypertension: Secondary | ICD-10-CM | POA: Insufficient documentation

## 2014-05-27 DIAGNOSIS — J449 Chronic obstructive pulmonary disease, unspecified: Secondary | ICD-10-CM | POA: Insufficient documentation

## 2014-05-27 DIAGNOSIS — Z8261 Family history of arthritis: Secondary | ICD-10-CM | POA: Diagnosis not present

## 2014-05-27 HISTORY — DX: Presence of spectacles and contact lenses: Z97.3

## 2014-05-27 HISTORY — PX: BREAST LUMPECTOMY WITH RADIOACTIVE SEED LOCALIZATION: SHX6424

## 2014-05-27 SURGERY — BREAST LUMPECTOMY WITH RADIOACTIVE SEED LOCALIZATION
Anesthesia: General | Site: Breast | Laterality: Right

## 2014-05-27 MED ORDER — MIDAZOLAM HCL 5 MG/5ML IJ SOLN
INTRAMUSCULAR | Status: DC | PRN
  Administered 2014-05-27: 2 mg via INTRAVENOUS

## 2014-05-27 MED ORDER — MIDAZOLAM HCL 2 MG/2ML IJ SOLN: 1.0000 mg | INTRAMUSCULAR | Status: DC | PRN

## 2014-05-27 MED ORDER — MIDAZOLAM HCL 2 MG/2ML IJ SOLN
INTRAMUSCULAR | Status: AC
  Filled 2014-05-27: qty 2

## 2014-05-27 MED ORDER — FENTANYL CITRATE 0.05 MG/ML IJ SOLN
INTRAMUSCULAR | Status: AC
  Filled 2014-05-27: qty 6

## 2014-05-27 MED ORDER — LIDOCAINE HCL (CARDIAC) 20 MG/ML IV SOLN
INTRAVENOUS | Status: DC | PRN
  Administered 2014-05-27: 80 mg via INTRAVENOUS

## 2014-05-27 MED ORDER — LACTATED RINGERS IV SOLN
INTRAVENOUS | Status: DC
  Administered 2014-05-27: 14:00:00 via INTRAVENOUS

## 2014-05-27 MED ORDER — FENTANYL CITRATE 0.05 MG/ML IJ SOLN: 25.0000 ug | INTRAMUSCULAR | Status: DC | PRN

## 2014-05-27 MED ORDER — FENTANYL CITRATE 0.05 MG/ML IJ SOLN
INTRAMUSCULAR | Status: DC | PRN
  Administered 2014-05-27 (×2): 50 ug via INTRAVENOUS

## 2014-05-27 MED ORDER — OXYCODONE HCL 5 MG PO TABS
ORAL_TABLET | ORAL | Status: AC
  Filled 2014-05-27: qty 1

## 2014-05-27 MED ORDER — HYDROMORPHONE HCL 1 MG/ML IJ SOLN
0.2500 mg | INTRAMUSCULAR | Status: DC | PRN
  Administered 2014-05-27: 0.5 mg via INTRAVENOUS

## 2014-05-27 MED ORDER — LIDOCAINE-EPINEPHRINE (PF) 1 %-1:200000 IJ SOLN
INTRAMUSCULAR | Status: DC | PRN
  Administered 2014-05-27: 3.5 mL via INTRAMUSCULAR

## 2014-05-27 MED ORDER — FENTANYL CITRATE 0.05 MG/ML IJ SOLN: 50.0000 ug | INTRAMUSCULAR | Status: DC | PRN

## 2014-05-27 MED ORDER — OXYCODONE-ACETAMINOPHEN 5-325 MG PO TABS: 1.0000 | ORAL_TABLET | ORAL | Status: DC | PRN

## 2014-05-27 MED ORDER — DEXAMETHASONE SODIUM PHOSPHATE 4 MG/ML IJ SOLN
INTRAMUSCULAR | Status: DC | PRN
  Administered 2014-05-27: 10 mg via INTRAVENOUS

## 2014-05-27 MED ORDER — PROPOFOL 10 MG/ML IV BOLUS
INTRAVENOUS | Status: DC | PRN
  Administered 2014-05-27: 200 mg via INTRAVENOUS

## 2014-05-27 MED ORDER — OXYCODONE HCL 5 MG/5ML PO SOLN: 5.0000 mg | Freq: Once | ORAL | Status: AC | PRN

## 2014-05-27 MED ORDER — CEFAZOLIN SODIUM-DEXTROSE 2-3 GM-% IV SOLR
INTRAVENOUS | Status: DC | PRN
  Administered 2014-05-27: 2 g via INTRAVENOUS

## 2014-05-27 MED ORDER — OXYCODONE HCL 5 MG PO TABS
5.0000 mg | ORAL_TABLET | Freq: Once | ORAL | Status: AC | PRN
  Administered 2014-05-27: 5 mg via ORAL

## 2014-05-27 MED ORDER — ONDANSETRON HCL 4 MG/2ML IJ SOLN
INTRAMUSCULAR | Status: DC | PRN
  Administered 2014-05-27: 4 mg via INTRAVENOUS

## 2014-05-27 MED ORDER — CEFAZOLIN SODIUM-DEXTROSE 2-3 GM-% IV SOLR: 2.0000 g | INTRAVENOUS | Status: DC

## 2014-05-27 MED ORDER — HYDROMORPHONE HCL 1 MG/ML IJ SOLN
INTRAMUSCULAR | Status: AC
  Filled 2014-05-27: qty 1

## 2014-05-27 SURGICAL SUPPLY — 57 items
APPLIER CLIP 9.375 MED OPEN (MISCELLANEOUS)
APR CLP MED 9.3 20 MLT OPN (MISCELLANEOUS)
BINDER BREAST LRG (GAUZE/BANDAGES/DRESSINGS) IMPLANT
BINDER BREAST MEDIUM (GAUZE/BANDAGES/DRESSINGS) IMPLANT
BINDER BREAST XLRG (GAUZE/BANDAGES/DRESSINGS) ×1 IMPLANT
BINDER BREAST XXLRG (GAUZE/BANDAGES/DRESSINGS) IMPLANT
BLADE HEX COATED 2.75 (ELECTRODE) ×2 IMPLANT
BLADE SURG 10 STRL SS (BLADE) ×1 IMPLANT
BLADE SURG 15 STRL LF DISP TIS (BLADE) ×1 IMPLANT
BLADE SURG 15 STRL SS (BLADE) ×2
CANISTER SUC SOCK COL 7IN (MISCELLANEOUS) IMPLANT
CANISTER SUCT 1200ML W/VALVE (MISCELLANEOUS) ×2 IMPLANT
CHLORAPREP W/TINT 26ML (MISCELLANEOUS) ×2 IMPLANT
CLIP APPLIE 9.375 MED OPEN (MISCELLANEOUS) IMPLANT
CLIP TI LARGE 6 (CLIP) ×2 IMPLANT
CLIP TI MEDIUM 6 (CLIP) IMPLANT
COVER BACK TABLE 60X90IN (DRAPES) ×2 IMPLANT
COVER MAYO STAND STRL (DRAPES) ×2 IMPLANT
COVER PROBE W GEL 5X96 (DRAPES) ×2 IMPLANT
DECANTER SPIKE VIAL GLASS SM (MISCELLANEOUS) ×1 IMPLANT
DEVICE DUBIN W/COMP PLATE 8390 (MISCELLANEOUS) ×2 IMPLANT
DRAPE LAPAROSCOPIC ABDOMINAL (DRAPES) ×2 IMPLANT
DRAPE UTILITY XL STRL (DRAPES) ×2 IMPLANT
ELECT REM PT RETURN 9FT ADLT (ELECTROSURGICAL) ×2
ELECTRODE REM PT RTRN 9FT ADLT (ELECTROSURGICAL) ×1 IMPLANT
GLOVE BIO SURGEON STRL SZ 6 (GLOVE) ×2 IMPLANT
GLOVE BIOGEL PI IND STRL 6.5 (GLOVE) ×1 IMPLANT
GLOVE BIOGEL PI INDICATOR 6.5 (GLOVE) ×2
GLOVE ECLIPSE 6.5 STRL STRAW (GLOVE) ×2 IMPLANT
GOWN STRL REUS W/ TWL LRG LVL3 (GOWN DISPOSABLE) ×1 IMPLANT
GOWN STRL REUS W/ TWL XL LVL3 (GOWN DISPOSABLE) ×1 IMPLANT
GOWN STRL REUS W/TWL LRG LVL3 (GOWN DISPOSABLE) ×2
GOWN STRL REUS W/TWL XL LVL3 (GOWN DISPOSABLE) ×2
KIT MARKER MARGIN INK (KITS) ×1 IMPLANT
LIQUID BAND (GAUZE/BANDAGES/DRESSINGS) ×2 IMPLANT
NDL HYPO 25X1 1.5 SAFETY (NEEDLE) ×1 IMPLANT
NEEDLE HYPO 25X1 1.5 SAFETY (NEEDLE) ×2 IMPLANT
NS IRRIG 1000ML POUR BTL (IV SOLUTION) ×1 IMPLANT
PACK BASIN DAY SURGERY FS (CUSTOM PROCEDURE TRAY) ×2 IMPLANT
PENCIL BUTTON HOLSTER BLD 10FT (ELECTRODE) ×2 IMPLANT
SLEEVE SCD COMPRESS KNEE MED (MISCELLANEOUS) ×2 IMPLANT
SPONGE GAUZE 4X4 12PLY STER LF (GAUZE/BANDAGES/DRESSINGS) ×2 IMPLANT
SPONGE LAP 18X18 X RAY DECT (DISPOSABLE) ×2 IMPLANT
STRIP CLOSURE SKIN 1/2X4 (GAUZE/BANDAGES/DRESSINGS) ×2 IMPLANT
SUT MNCRL AB 4-0 PS2 18 (SUTURE) ×2 IMPLANT
SUT MON AB 5-0 PS2 18 (SUTURE) IMPLANT
SUT SILK 2 0 SH (SUTURE) ×1 IMPLANT
SUT VIC AB 2-0 SH 27 (SUTURE) ×2
SUT VIC AB 2-0 SH 27XBRD (SUTURE) ×1 IMPLANT
SUT VIC AB 3-0 SH 27 (SUTURE) ×2
SUT VIC AB 3-0 SH 27X BRD (SUTURE) ×1 IMPLANT
SUT VIC AB 5-0 PS2 18 (SUTURE) IMPLANT
SYR CONTROL 10ML LL (SYRINGE) ×2 IMPLANT
TOWEL OR 17X24 6PK STRL BLUE (TOWEL DISPOSABLE) ×2 IMPLANT
TOWEL OR NON WOVEN STRL DISP B (DISPOSABLE) ×2 IMPLANT
TUBE CONNECTING 20X1/4 (TUBING) ×1 IMPLANT
YANKAUER SUCT BULB TIP NO VENT (SUCTIONS) ×1 IMPLANT

## 2014-05-27 NOTE — Transfer of Care (Signed)
Immediate Anesthesia Transfer of Care Note  Patient: Patricia Aguirre  Procedure(s) Performed: Procedure(s): RIGHT BREAST LUMPECTOMY WITH RADIOACTIVE SEED LOCALIZATION (Right)  Patient Location: PACU  Anesthesia Type:General  Level of Consciousness: awake and patient cooperative  Airway & Oxygen Therapy: Patient Spontanous Breathing and Patient connected to face mask oxygen  Post-op Assessment: Report given to PACU RN and Post -op Vital signs reviewed and stable  Post vital signs: Reviewed and stable  Complications: No apparent anesthesia complications

## 2014-05-27 NOTE — Discharge Instructions (Addendum)
[facility] °Office Phone Number [phone] ° °BREAST BIOPSY/ PARTIAL MASTECTOMY: POST OP INSTRUCTIONS ° °Always review your discharge instruction sheet given to you by the facility where your surgery was performed. ° °IF YOU HAVE DISABILITY OR FAMILY LEAVE FORMS, YOU MUST BRING THEM TO THE OFFICE FOR PROCESSING.  DO NOT GIVE THEM TO YOUR DOCTOR. ° °1. A prescription for pain medication may be given to you upon discharge.  Take your pain medication as prescribed, if needed.  If narcotic pain medicine is not needed, then you may take acetaminophen (Tylenol) or ibuprofen (Advil) as needed. °2. Take your usually prescribed medications unless otherwise directed °3. If you need a refill on your pain medication, please contact your pharmacy.  They will contact our office to request authorization.  Prescriptions will not be filled after 5pm or on week-ends. °4. You should eat very light the first 24 hours after surgery, such as soup, crackers, pudding, etc.  Resume your normal diet the day after surgery. °5. Most patients will experience some swelling and bruising in the breast.  Ice packs and a good support bra will help.  Swelling and bruising can take several days to resolve.  °6. It is common to experience some constipation if taking pain medication after surgery.  Increasing fluid intake and taking a stool softener will usually help or prevent this problem from occurring.  A mild laxative (Milk of Magnesia or Miralax) should be taken according to package directions if there are no bowel movements after 48 hours. °7. Unless discharge instructions indicate otherwise, you may remove your bandages 48 hours after surgery, and you may shower at that time.  You may have steri-strips (small skin tapes) in place directly over the incision.  These strips should be left on the skin for 7-10 days.   Any sutures or staples will be removed at the office during your follow-up visit. °8. ACTIVITIES:  You may resume  regular daily activities (gradually increasing) beginning the next day.  Wearing a good support bra or sports bra (or the breast binder) minimizes pain and swelling.  You may have sexual intercourse when it is comfortable. °a. You may drive when you no longer are taking prescription pain medication, you can comfortably wear a seatbelt, and you can safely maneuver your car and apply brakes. °b. RETURN TO WORK:  __________1 week_______________ °9. You should see your doctor in the office for a follow-up appointment approximately two weeks after your surgery.  Your doctor’s nurse will typically make your follow-up appointment when she calls you with your pathology report.  Expect your pathology report 2-3 business days after your surgery.  You may call to check if you do not hear from us after three days. ° ° °WHEN TO CALL YOUR DOCTOR: °1. Fever over 101.0 °2. Nausea and/or vomiting. °3. Extreme swelling or bruising. °4. Continued bleeding from incision. °5. Increased pain, redness, or drainage from the incision. ° °The clinic staff is available to answer your questions during regular business hours.  Please don’t hesitate to call and ask to speak to one of the nurses for clinical concerns.  If you have a medical emergency, go to the nearest emergency room or call 911.  A surgeon from [facility] is always on call at the hospital. ° °For further questions, please visit [email or web address]  ° ° °Post Anesthesia Home Care Instructions ° °Activity: °Get plenty of rest for the remainder of the day. A responsible adult should stay with you for 24   hours following the procedure.  °For the next 24 hours, DO NOT: °-Drive a car °-Operate machinery °-Drink alcoholic beverages °-Take any medication unless instructed by your physician °-Make any legal decisions or sign important papers. ° °Meals: °Start with liquid foods such as gelatin or soup. Progress to regular foods as tolerated. Avoid greasy, spicy, heavy  foods. If nausea and/or vomiting occur, drink only clear liquids until the nausea and/or vomiting subsides. Call your physician if vomiting continues. ° °Special Instructions/Symptoms: °Your throat may feel dry or sore from the anesthesia or the breathing tube placed in your throat during surgery. If this causes discomfort, gargle with warm salt water. The discomfort should disappear within 24 hours. ° ° °

## 2014-05-27 NOTE — Anesthesia Postprocedure Evaluation (Signed)
  Anesthesia Post-op Note  Patient: Patricia Aguirre  Procedure(s) Performed: Procedure(s): RIGHT BREAST LUMPECTOMY WITH RADIOACTIVE SEED LOCALIZATION (Right)  Patient Location: PACU  Anesthesia Type:General  Level of Consciousness: awake and alert   Airway and Oxygen Therapy: Patient Spontanous Breathing  Post-op Pain: none  Post-op Assessment: Post-op Vital signs reviewed  Post-op Vital Signs: Reviewed  Last Vitals:  Filed Vitals:   05/27/14 1635  BP: 121/74  Pulse: 72  Temp: 36.4 C  Resp: 16    Complications: No apparent anesthesia complications

## 2014-05-27 NOTE — H&P (Signed)
Patricia SheerJean Coronado 05/10/2014 1:59 PM Location: [facility] Patient #: [id number] DOB: [birth year] Single / Language: [place] / Race: Black or African American Female  History of Present Illness Almond Lint(Shakthi Scipio MD; 05/11/2014 6:11 PM) Patient words: intial visit atyplical ductal hyperplasia.  The patient is a 63 year old female who presents with a complaint of Breast problems. Pt is a 63 yo F who presents wtih an abnormal mammogram. She is referred by Dr. Annia Beltrew Davis for consultation regarding this matter. She had a 6 mm area of suspicious linear calcifications at 6 oclock in the right breast. This was biopsied and it was seen to be atypical ductal hyperplasia which was discordant with the imaging. She denies breast pain. She has had to have callback mammograms, but no biopsy before. She is otherwise well. She has no family history of breast cancer. She denies breast trauma.    Other Problems Jake Church(Jason McDowell, LPN; 04/54/098111/30/2015 1:59 PM) Arthritis Back Pain Chronic Obstructive Lung Disease Cirrhosis Of Liver Gastroesophageal Reflux Disease Hepatitis High blood pressure  Past Surgical History Jake Church(Jason McDowell, LPN; 19/14/782911/30/2015 1:59 PM) Breast Biopsy Right.  Diagnostic Studies History Jake Church(Jason McDowell, LPN; 56/21/308611/30/2015 1:59 PM) Colonoscopy within last year Mammogram within last year Pap Smear 1-5 years ago  Allergies Jake Church(Jason McDowell, LPN; 57/84/696211/30/2015 2:00 PM) No Known Drug Allergies11/30/2015  Medication History Jake Church(Jason McDowell, LPN; 95/28/413211/30/2015 2:03 PM) Lactulose (10GM/15ML Solution, Oral prn) Active. Losartan Potassium-HCTZ (100-25MG  Tablet, Oral prn) Active. Mucinex DM Maximum Strength (60-1200MG  Tablet ER 12HR, Oral prn) Active. Allegra Allergy (60MG  Tablet, Oral prn) Active. Multivitamins (Oral prn) Active. Omega 3 (1000MG  Capsule, Oral prn) Active. PriLOSEC (20MG  Capsule DR, Oral prn) Active. Benadryl Allergy (25MG  Tablet, Oral prn) Active.  Social  History Jake Church(Jason McDowell, LPN; 44/01/027211/30/2015 1:59 PM) Alcohol use Recently quit alcohol use. Caffeine use Carbonated beverages, Coffee, Tea. No drug use Tobacco use Former smoker.  Family History Jake Church(Jason McDowell, LPN; 53/66/440311/30/2015 1:59 PM) Arthritis Brother, Father. Cerebrovascular Accident Mother. Diabetes Mellitus Brother, Mother. Heart Disease Father. Hypertension Mother.  Pregnancy / Birth History Jake Church(Jason McDowell, LPN; 47/42/595611/30/2015 1:59 PM) Age at menarche 12 years. Age of menopause 59>60 Gravida 1 Irregular periods Maternal age 62-20 Para 1  Review of Systems Jake Church(Jason McDowell LPN; 38/75/643311/30/2015 1:59 PM) General Present- Fatigue. Not Present- Appetite Loss, Chills, Fever, Night Sweats, Weight Gain and Weight Loss. Skin Present- Dryness. Not Present- Change in Wart/Mole, Hives, Jaundice, New Lesions, Non-Healing Wounds, Rash and Ulcer. HEENT Present- Nose Bleed and Wears glasses/contact lenses. Not Present- Earache, Hearing Loss, Hoarseness, Oral Ulcers, Ringing in the Ears, Seasonal Allergies, Sinus Pain, Sore Throat, Visual Disturbances and Yellow Eyes. Breast Not Present- Breast Mass, Breast Pain, Nipple Discharge and Skin Changes. Cardiovascular Present- Leg Cramps. Not Present- Chest Pain, Difficulty Breathing Lying Down, Palpitations, Rapid Heart Rate, Shortness of Breath and Swelling of Extremities. Gastrointestinal Present- Constipation. Not Present- Abdominal Pain, Bloating, Bloody Stool, Change in Bowel Habits, Chronic diarrhea, Difficulty Swallowing, Excessive gas, Gets full quickly at meals, Hemorrhoids, Indigestion, Nausea, Rectal Pain and Vomiting. Female Genitourinary Not Present- Frequency, Nocturia, Painful Urination, Pelvic Pain and Urgency. Musculoskeletal Present- Joint Pain. Not Present- Back Pain, Joint Stiffness, Muscle Pain, Muscle Weakness and Swelling of Extremities. Neurological Present- Numbness. Not Present- Decreased Memory, Fainting, Headaches,  Seizures, Tingling, Tremor, Trouble walking and Weakness. Psychiatric Present- Change in Sleep Pattern. Not Present- Anxiety, Bipolar, Depression, Fearful and Frequent crying. Endocrine Not Present- Cold Intolerance, Excessive Hunger, Hair Changes, Heat Intolerance, Hot flashes and New Diabetes. Hematology Not Present- Easy Bruising, Excessive bleeding, Gland  problems, HIV and Persistent Infections.   Vitals Jake Church(Jason McDowell LPN; 16/10/960411/30/2015 2:04 PM) 05/10/2014 2:03 PM Weight: 166.5 lb Height: 66in Body Surface Area: 1.88 m Body Mass Index: 26.87 kg/m Temp.: 98.80F(Temporal)  Pulse: 70 (Regular)  Resp.: 20 (Unlabored)  BP: 130/92 (Sitting, Left Arm, Standard)    Physical Exam Almond Lint(Ariele Vidrio MD; 05/11/2014 6:12 PM) General Mental Status-Alert. General Appearance-Consistent with stated age. Hydration-Well hydrated. Voice-Normal.  Head and Neck Head-normocephalic, atraumatic with no lesions or palpable masses. Trachea-midline. Thyroid Gland Characteristics - normal size and consistency.  Eye Eyeball - Bilateral-Extraocular movements intact. Sclera/Conjunctiva - Bilateral-No scleral icterus.  Chest and Lung Exam Chest and lung exam reveals -quiet, even and easy respiratory effort with no use of accessory muscles and on auscultation, normal breath sounds, no adventitious sounds and normal vocal resonance. Inspection Chest Wall - Normal. Back - normal.  Breast Note: Breasts are symmetric and ptotic bilaterally. No nipple retraction. No skin dimpling. No palpable masses. no lymphadenopathy.   Cardiovascular Cardiovascular examination reveals -normal heart sounds, regular rate and rhythm with no murmurs and normal pedal pulses bilaterally.  Abdomen Inspection Inspection of the abdomen reveals - No Hernias. Palpation/Percussion Palpation and Percussion of the abdomen reveal - Soft, Non Tender, No Rebound tenderness, No Rigidity (guarding) and  No hepatosplenomegaly. Auscultation Auscultation of the abdomen reveals - Bowel sounds normal.  Neurologic Neurologic evaluation reveals -alert and oriented x 3 with no impairment of recent or remote memory. Mental Status-Normal.  Musculoskeletal Global Assessment -Note: no gross deformities.  Normal Exam - Left-Upper Extremity Strength Normal and Lower Extremity Strength Normal. Normal Exam - Right-Upper Extremity Strength Normal and Lower Extremity Strength Normal.  Lymphatic Head & Neck  General Head & Neck Lymphatics: Bilateral - Description - Normal. Axillary  General Axillary Region: Bilateral - Description - Normal. Tenderness - Non Tender. Femoral & Inguinal  Generalized Femoral & Inguinal Lymphatics: Bilateral - Description - No Generalized lymphadenopathy.    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 We discussed rationale for surgery. We also discussed the possibility of finding a benign result. We discussed risks of bleeding, infection, pain, possible need for additional procedures.  I discussed the procedure for getting a seed placed. I also reviewed post op restrictions and time needed off work. She is going to consider and schedule surgery. Current Plans  Schedule for Surgery Pt Education - [facility] Breast Biopsy Instructions (FLB): discussed with patient and provided information.   Signed by Almond LintFaera Melessa Cowell, MD (05/11/2014 6:14 PM)

## 2014-05-27 NOTE — Interval H&P Note (Signed)
History and Physical Interval Note:  05/27/2014 1:25 PM  Patricia Aguirre  has presented today for surgery, with the diagnosis of abnormal mammogram with calcifications, ADH  The various methods of treatment have been discussed with the patient and family. After consideration of risks, benefits and other options for treatment, the patient has consented to  Procedure(s): RIGHT BREAST LUMPECTOMY WITH RADIOACTIVE SEED LOCALIZATION (Right) as a surgical intervention .  The patient's history has been reviewed, patient examined, no change in status, stable for surgery.  I have reviewed the patient's chart and labs.  Questions were answered to the patient's satisfaction.     Chung Chagoya

## 2014-05-27 NOTE — Anesthesia Procedure Notes (Signed)
Procedure Name: LMA Insertion Date/Time: 05/27/2014 2:47 PM Performed by: Genevieve NorlanderLINKA, Anora Schwenke L Pre-anesthesia Checklist: Patient identified, Emergency Drugs available, Suction available, Patient being monitored and Timeout performed Patient Re-evaluated:Patient Re-evaluated prior to inductionOxygen Delivery Method: Circle System Utilized Preoxygenation: Pre-oxygenation with 100% oxygen Intubation Type: IV induction Ventilation: Mask ventilation without difficulty LMA: LMA inserted LMA Size: 4.0 Number of attempts: 1 Airway Equipment and Method: bite block Placement Confirmation: positive ETCO2 Tube secured with: Tape Dental Injury: Teeth and Oropharynx as per pre-operative assessment

## 2014-05-27 NOTE — Anesthesia Preprocedure Evaluation (Signed)
Anesthesia Evaluation  Patient identified by MRN, date of birth, ID band Patient awake    Reviewed: Allergy & Precautions, H&P , NPO status , Patient's Chart, lab work & pertinent test results  Airway Mallampati: II  TM Distance: >3 FB Neck ROM: Full    Dental no notable dental hx. (+) Teeth Intact, Dental Advisory Given   Pulmonary Current Smoker, former smoker,  breath sounds clear to auscultation  Pulmonary exam normal       Cardiovascular hypertension, Pt. on medications + CAD Rhythm:Regular Rate:Normal     Neuro/Psych negative neurological ROS  negative psych ROS   GI/Hepatic hiatal hernia, (+) Hepatitis -, C  Endo/Other  negative endocrine ROS  Renal/GU negative Renal ROS  negative genitourinary   Musculoskeletal   Abdominal   Peds  Hematology negative hematology ROS (+)   Anesthesia Other Findings   Reproductive/Obstetrics negative OB ROS                             Anesthesia Physical  Anesthesia Plan  ASA: III  Anesthesia Plan: General   Post-op Pain Management:    Induction: Intravenous  Airway Management Planned: LMA  Additional Equipment:   Intra-op Plan:   Post-operative Plan: Extubation in OR  Informed Consent: I have reviewed the patients History and Physical, chart, labs and discussed the procedure including the risks, benefits and alternatives for the proposed anesthesia with the patient or authorized representative who has indicated his/her understanding and acceptance.   Dental advisory given  Plan Discussed with: CRNA and Surgeon  Anesthesia Plan Comments:         Anesthesia Quick Evaluation

## 2014-05-27 NOTE — Op Note (Signed)
Right Breast Radioactive seed localized lumpectomy  Indications: This patient presents with history of abnormal mammogram and ADH on biopsy (discordant)  Pre-operative Diagnosis: abnormal mammogram  Post-operative Diagnosis: Same  Surgeon: Stark Klein   Anesthesia: General endotracheal anesthesia  ASA Class: 3  Procedure Details  The patient was seen in the Holding Room. The risks, benefits, complications, treatment options, and expected outcomes were discussed with the patient. The possibilities of bleeding, infection, the need for additional procedures, failure to diagnose a condition, and creating a complication requiring transfusion or operation were discussed with the patient. The patient concurred with the proposed plan, giving informed consent.  The site of surgery properly noted/marked. The patient was taken to Operating Room # 8, identified, and the procedure verified as right Breast Lumpectomy. A Time Out was held and the above information confirmed.  The right arm, breast, and chest were prepped and draped in standard fashion. The lumpectomy was performed by creating an transverse incision over the lower outer quadrant of the breast over the previously placed radioactive seed.  Dissection was carried down to around the point of maximum signal intensity. The cautery was used to perform the dissection.  Hemostasis was achieved with cautery. The edges of the cavity were marked with large clips, with one each medial, lateral, inferior and superior, and two clips posteriorly.   The specimen was inked with the margin marker paint kit.    Specimen radiography confirmed inclusion of the mammographic lesion, the clip, and the seed.  The background signal in the breast was zero.  The wound was irrigated and closed with 3-0 vicryl in layers and 4-0 monocryl subcuticular suture.      Sterile dressings were applied. At the end of the operation, all sponge, instrument, and needle counts were  correct.  Findings: grossly clear surgical margins and no adenopathy  Estimated Blood Loss:  min         Specimens: right breast lumpectomy.           Complications:  None; patient tolerated the procedure well.         Disposition: PACU - hemodynamically stable.         Condition: stable

## 2014-05-31 ENCOUNTER — Encounter (HOSPITAL_BASED_OUTPATIENT_CLINIC_OR_DEPARTMENT_OTHER): Payer: Self-pay | Admitting: General Surgery

## 2014-06-01 ENCOUNTER — Telehealth: Payer: Self-pay | Admitting: *Deleted

## 2014-06-01 NOTE — Telephone Encounter (Signed)
Received signed ROI from patient's PCP seeking her last office note.  Faxed note, associated labs drawn that day as requested to Dr. Nehemiah SettlePolite at [phone]. Andree CossHowell, Shatyra Becka M, RN

## 2014-06-07 NOTE — Progress Notes (Signed)
Quick Note:  Please let patient know there is no cancer in the breast specimen. ______

## 2014-06-23 ENCOUNTER — Telehealth: Payer: Self-pay | Admitting: *Deleted

## 2014-06-23 NOTE — Telephone Encounter (Signed)
Patricia EpsteinKimberly Joiner, RN a Cytogeneticistcase-manager at [employer]. Wanted to let Dr. Luciana Axeomer know that patient has enlisted case management services via her insurance.  Case manager is available if we need any assistance in pt's care.  Will cc Ulyses SouthwardMinh Pham in case there is a need to coordinate care for patient's HCV treatment. Andree CossHowell, Marquia Costello M, RN

## 2014-07-07 ENCOUNTER — Ambulatory Visit (INDEPENDENT_AMBULATORY_CARE_PROVIDER_SITE_OTHER): Payer: BLUE CROSS/BLUE SHIELD | Admitting: Internal Medicine

## 2014-07-07 ENCOUNTER — Telehealth: Payer: Self-pay | Admitting: *Deleted

## 2014-07-07 ENCOUNTER — Encounter: Payer: Self-pay | Admitting: Internal Medicine

## 2014-07-07 VITALS — BP 139/81 | HR 80 | Temp 98.4°F | Ht 67.0 in | Wt 173.0 lb

## 2014-07-07 DIAGNOSIS — K7460 Unspecified cirrhosis of liver: Secondary | ICD-10-CM

## 2014-07-07 DIAGNOSIS — B182 Chronic viral hepatitis C: Secondary | ICD-10-CM

## 2014-07-07 DIAGNOSIS — Z23 Encounter for immunization: Secondary | ICD-10-CM

## 2014-07-07 NOTE — Telephone Encounter (Signed)
Left message with Dr. Henriette CombsJohnson's nurse letting him know we've recommended that the patient continue to follow up with GI.  Will fax visit notes and lab work to [phone]. Andree CossHowell, Michelle M, RN

## 2014-07-07 NOTE — Assessment & Plan Note (Signed)
She has done well with treatment and now completed. I will get her end of treatment lab today. She will come back in 3 months for her post treatment lab.

## 2014-07-07 NOTE — Progress Notes (Signed)
   Subjective:    Patient ID: Gardiner RamusJean C Quinton, female    DOB: 07-20-1950, 64 y.o.   MRN: [id number]  HPI Here for follow up of HCV.  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 Undetectable after 4 weeks. Cirrhosis and had EGD by [facility].  Now completed [facility].  No issues.     Review of Systems  Constitutional: Negative for fatigue.  Gastrointestinal: Negative for nausea and diarrhea.  Skin: Negative for rash.  Neurological: Negative for headaches.       Objective:   Physical Exam  Constitutional: She appears well-developed and well-nourished. No distress.  Eyes: No scleral icterus.  Cardiovascular: Normal rate, regular rhythm and normal heart sounds.   No murmur heard. Pulmonary/Chest: Effort normal and breath sounds normal. No respiratory distress.  Skin: No rash noted.          Assessment & Plan:

## 2014-07-07 NOTE — Assessment & Plan Note (Signed)
[facility] and will defer to their management. She is on lactulose.

## 2014-07-08 LAB — HEPATITIS C RNA QUANTITATIVE: HCV Quantitative: NOT DETECTED IU/mL (ref <15)

## 2014-07-09 ENCOUNTER — Telehealth: Payer: Self-pay | Admitting: *Deleted

## 2014-07-09 NOTE — Telephone Encounter (Signed)
-----   Message from Gardiner Barefootobert W Comer, MD sent at 07/09/2014 11:58 AM EST ----- Please let her know her HCV virus remains undetectable.  thanks

## 2014-07-09 NOTE — Telephone Encounter (Signed)
Patient notified Patricia Aguirre  

## 2014-08-09 ENCOUNTER — Ambulatory Visit: Payer: BLUE CROSS/BLUE SHIELD

## 2014-09-21 ENCOUNTER — Encounter: Payer: Self-pay | Admitting: Internal Medicine

## 2014-09-21 ENCOUNTER — Ambulatory Visit (INDEPENDENT_AMBULATORY_CARE_PROVIDER_SITE_OTHER): Payer: BLUE CROSS/BLUE SHIELD | Admitting: Internal Medicine

## 2014-09-21 VITALS — BP 131/79 | HR 83 | Temp 98.4°F | Wt 173.5 lb

## 2014-09-21 DIAGNOSIS — B182 Chronic viral hepatitis C: Secondary | ICD-10-CM | POA: Diagnosis not present

## 2014-09-21 DIAGNOSIS — Z23 Encounter for immunization: Secondary | ICD-10-CM | POA: Diagnosis not present

## 2014-09-21 DIAGNOSIS — K7460 Unspecified cirrhosis of liver: Secondary | ICD-10-CM

## 2014-09-21 NOTE — Assessment & Plan Note (Signed)
[facility]  I believe Dr. Dulce Sellarutlaw is doing this, though if not, she can return here every 6 months with ultrasound if preferred.

## 2014-09-21 NOTE — Progress Notes (Signed)
   Subjective:    Patient ID: Gardiner RamusJean C Preble, female    DOB: 1950-11-09, 64 y.o.   MRN: [id number]  HPI Here for follow up of HCV.  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 Undetectable after 4 weeks. Cirrhosis and had EGD by [facility].  Completed Harvoni 3 months ago.  No issues.     Review of Systems  Constitutional: Negative for fatigue.  Gastrointestinal: Negative for nausea and diarrhea.  Skin: Negative for rash.  Neurological: Negative for headaches.       Objective:   Physical Exam  Constitutional: She appears well-developed and well-nourished. No distress.  Eyes: No scleral icterus.  Cardiovascular: Normal rate, regular rhythm and normal heart sounds.   No murmur heard. Pulmonary/Chest: Effort normal and breath sounds normal. No respiratory distress.  Skin: No rash noted.          Assessment & Plan:

## 2014-09-21 NOTE — Assessment & Plan Note (Signed)
Will get SVR 12 today and hep B #2.  Then rtc in 4 months for SVR 24 and hep B #3.

## 2014-09-22 LAB — HEPATITIS C RNA QUANTITATIVE: HCV QUANT: NOT DETECTED [IU]/mL (ref <15)

## 2015-01-06 ENCOUNTER — Ambulatory Visit: Payer: BLUE CROSS/BLUE SHIELD

## 2015-01-18 ENCOUNTER — Ambulatory Visit (INDEPENDENT_AMBULATORY_CARE_PROVIDER_SITE_OTHER): Payer: BLUE CROSS/BLUE SHIELD | Admitting: Internal Medicine

## 2015-01-18 ENCOUNTER — Encounter: Payer: Self-pay | Admitting: Internal Medicine

## 2015-01-18 VITALS — BP 116/76 | HR 76 | Temp 97.7°F | Ht 66.0 in | Wt 178.0 lb

## 2015-01-18 DIAGNOSIS — Z23 Encounter for immunization: Secondary | ICD-10-CM

## 2015-01-18 DIAGNOSIS — B1920 Unspecified viral hepatitis C without hepatic coma: Secondary | ICD-10-CM

## 2015-01-18 DIAGNOSIS — K7682 Hepatic encephalopathy: Secondary | ICD-10-CM

## 2015-01-18 DIAGNOSIS — K7290 Hepatic failure, unspecified without coma: Secondary | ICD-10-CM | POA: Diagnosis not present

## 2015-01-18 DIAGNOSIS — K7460 Unspecified cirrhosis of liver: Secondary | ICD-10-CM | POA: Diagnosis not present

## 2015-01-18 NOTE — Assessment & Plan Note (Signed)
He has not been back to Dr. Dulce Sellar of GI so will do [facility] screening.  I discussed with her that she will need to continue with ultrasound screening for cancer every 6 months and needs to establish with PCP and GI or ID at her new location.  She voiced her understanding.

## 2015-01-18 NOTE — Assessment & Plan Note (Signed)
She stopped taking her lactulose. I emphasized compliance.

## 2015-01-18 NOTE — Progress Notes (Signed)
   Subjective:    Patient ID: Patricia Aguirre, female    DOB: 30-Jan-1951, 64 y.o.   MRN: [id number]  HPI Here for follow up of HCV.  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 Undetectable after 4 weeks. Cirrhosis and had EGD by [facility].  Completed [facility] 6 months ago.  No issues.  SVR12 undetectable.  Stopped taking lactulose. Moving out of town in a couple of months.  No weight loss, no diarrhea.  No confusion, no abdominal swelling.  No jaundice.     Review of Systems  Constitutional: Negative for fatigue.  Gastrointestinal: Negative for nausea and diarrhea.  Skin: Negative for rash.  Neurological: Negative for headaches.   History   Social History  . Marital Status: Single    Spouse Name: N/A  . Number of Children: N/A  . Years of Education: N/A   Occupational History  . Not on file.   Social History Main Topics  . Smoking status: Former Smoker -- 0.25 packs/day for 20 years    Types: Cigarettes    Quit date: 05/25/2013  . Smokeless tobacco: Never Used     Comment: working on quitting!  . Alcohol Use: No     Comment: last -occ   . Drug Use: No  . Sexual Activity: No     Comment: stopped-   Other Topics Concern  . Not on file   Social History Narrative   Past Medical History  Diagnosis Date  . Coronary artery disease   . Hypertension   . H/O hiatal hernia   . Arthritis   . Hepatitis     Hep C; dx 1 week ago 10/2113  . Wears glasses   cirrhosis     Objective:   Physical Exam  Constitutional: She appears well-developed and well-nourished. No distress.  Eyes: No scleral icterus.  Cardiovascular: Normal rate, regular rhythm and normal heart sounds.   No murmur heard. Pulmonary/Chest: Effort normal and breath sounds normal. No respiratory distress.  Skin: No rash noted.          Assessment & Plan:

## 2015-01-18 NOTE — Assessment & Plan Note (Signed)
SVR24 today to verify cure.

## 2015-01-20 LAB — HEPATITIS C RNA QUANTITATIVE: HCV Quantitative: NOT DETECTED IU/mL (ref <15)

## 2015-01-21 ENCOUNTER — Ambulatory Visit
Admission: RE | Admit: 2015-01-21 | Discharge: 2015-01-21 | Disposition: A | Discharge status: Home / Self Care | Payer: BLUE CROSS/BLUE SHIELD | Source: Ambulatory Visit | Attending: Internal Medicine | Admitting: Internal Medicine

## 2015-01-21 DIAGNOSIS — K7460 Unspecified cirrhosis of liver: Secondary | ICD-10-CM

## 2015-10-12 ENCOUNTER — Ambulatory Visit (INDEPENDENT_AMBULATORY_CARE_PROVIDER_SITE_OTHER): Payer: BLUE CROSS/BLUE SHIELD | Admitting: Family

## 2015-10-12 ENCOUNTER — Encounter: Payer: Self-pay | Admitting: Family

## 2015-10-12 VITALS — BP 126/90 | HR 85 | Temp 98.0°F | Resp 16 | Ht 67.0 in | Wt 182.0 lb

## 2015-10-12 DIAGNOSIS — I159 Secondary hypertension, unspecified: Secondary | ICD-10-CM

## 2015-10-12 DIAGNOSIS — K2100 Gastro-esophageal reflux disease with esophagitis, without bleeding: Secondary | ICD-10-CM

## 2015-10-12 DIAGNOSIS — Z0000 Encounter for general adult medical examination without abnormal findings: Secondary | ICD-10-CM

## 2015-10-12 DIAGNOSIS — K219 Gastro-esophageal reflux disease without esophagitis: Secondary | ICD-10-CM | POA: Insufficient documentation

## 2015-10-12 DIAGNOSIS — G479 Sleep disorder, unspecified: Secondary | ICD-10-CM

## 2015-10-12 MED ORDER — OMEPRAZOLE MAGNESIUM 20 MG PO TBEC
20.0000 mg | DELAYED_RELEASE_TABLET | Freq: Every day | ORAL | Status: DC | PRN
Start: 2015-10-12 — End: 2016-11-19

## 2015-10-12 MED ORDER — TRAZODONE HCL 50 MG PO TABS: 50.0000 mg | ORAL_TABLET | Freq: Every evening | ORAL | Status: DC | PRN

## 2015-10-12 MED ORDER — LOSARTAN POTASSIUM-HCTZ 100-25 MG PO TABS: 1.0000 | ORAL_TABLET | Freq: Every morning | ORAL | Status: DC

## 2015-10-12 NOTE — Progress Notes (Signed)
Pre visit review using our clinic review tool, if applicable. No additional management support is needed unless otherwise documented below in the visit note. 

## 2015-10-12 NOTE — Patient Instructions (Signed)
Thank you for choosing [facility].  Summary/Instructions:  Your prescription(s) have been submitted to your pharmacy or been printed and provided for you. Please take as directed and contact our office if you believe you are having problem(s) with the medication(s) or have any questions.  Please stop by the lab on the basement level of the building for your blood work. Your results will be released to [place] [facility] (or called to you) after review, usually within 72 hours after test completion. If any changes need to be made, you will be notified at that same time.  If your symptoms worsen or fail to improve, please contact our office for further instruction, or in case of emergency go directly to the emergency room at the closest medical facility.   Health Maintenance, Female Adopting a healthy lifestyle and getting preventive care can go a long way to promote health and wellness. Talk with your health care provider about what schedule of regular examinations is right for you. This is a good chance for you to check in with your provider about disease prevention and staying healthy. In between checkups, there are plenty of things you can do on your own. Experts have done a lot of research about which lifestyle changes and preventive measures are most likely to keep you healthy. Ask your health care provider for more information. WEIGHT AND DIET  Eat a healthy diet  Be sure to include plenty of vegetables, fruits, low-fat dairy products, and lean protein.  Do not eat a lot of foods high in solid fats, added sugars, or salt.  Get regular exercise. This is one of the most important things you can do for your health.  Most adults should exercise for at least 150 minutes each week. The exercise should increase your heart rate and make you sweat (moderate-intensity exercise).  Most adults should also do strengthening exercises at least twice a week. This is in addition to the moderate-intensity  exercise.  Maintain a healthy weight  Body mass index (BMI) is a measurement that can be used to identify possible weight problems. It estimates body fat based on height and weight. Your health care provider can help determine your BMI and help you achieve or maintain a healthy weight.  For females 65 years of age and older:   A BMI below 18.5 is considered underweight.  A BMI of 18.5 to 24.9 is normal.  A BMI of 25 to 29.9 is considered overweight.  A BMI of 30 and above is considered obese.  Watch levels of cholesterol and blood lipids  You should start having your blood tested for lipids and cholesterol at 64 years of age, then have this test every 5 years.  You may need to have your cholesterol levels checked more often if:  Your lipid or cholesterol levels are high.  You are older than 64 years of age.  You are at high risk for heart disease.  CANCER SCREENING   Lung Cancer  Lung cancer screening is recommended for adults 41-79 years old who are at high risk for lung cancer because of a history of smoking.  A yearly low-dose CT scan of the lungs is recommended for people who:  Currently smoke.  Have quit within the past 15 years.  Have at least a 30-pack-year history of smoking. A pack year is smoking an average of one pack of cigarettes a day for 1 year.  Yearly screening should continue until it has been 15 years since you quit.  Yearly screening should stop if you develop a health problem that would prevent you from having lung cancer treatment.  Breast Cancer  Practice breast self-awareness. This means understanding how your breasts normally appear and feel.  It also means doing regular breast self-exams. Let your health care provider know about any changes, no matter how small.  If you are in your 20s or 30s, you should have a clinical breast exam (CBE) by a health care provider every 1-3 years as part of a regular health exam.  If you are 33 or  older, have a CBE every year. Also consider having a breast X-ray (mammogram) every year.  If you have a family history of breast cancer, talk to your health care provider about genetic screening.  If you are at high risk for breast cancer, talk to your health care provider about having an MRI and a mammogram every year.  Breast cancer gene (BRCA) assessment is recommended for women who have family members with BRCA-related cancers. BRCA-related cancers include:  Breast.  Ovarian.  Tubal.  Peritoneal cancers.  Results of the assessment will determine the need for genetic counseling and BRCA1 and BRCA2 testing. Cervical Cancer Your health care provider may recommend that you be screened regularly for cancer of the pelvic organs (ovaries, uterus, and vagina). This screening involves a pelvic examination, including checking for microscopic changes to the surface of your cervix (Pap test). You may be encouraged to have this screening done every 3 years, beginning at age 33.  For women ages 40-65, health care providers may recommend pelvic exams and Pap testing every 3 years, or they may recommend the Pap and pelvic exam, combined with testing for human papilloma virus (HPV), every 5 years. Some types of HPV increase your risk of cervical cancer. Testing for HPV may also be done on women of any age with unclear Pap test results.  Other health care providers may not recommend any screening for nonpregnant women who are considered low risk for pelvic cancer and who do not have symptoms. Ask your health care provider if a screening pelvic exam is right for you.  If you have had past treatment for cervical cancer or a condition that could lead to cancer, you need Pap tests and screening for cancer for at least 20 years after your treatment. If Pap tests have been discontinued, your risk factors (such as having a new sexual partner) need to be reassessed to determine if screening should resume. Some  women have medical problems that increase the chance of getting cervical cancer. In these cases, your health care provider may recommend more frequent screening and Pap tests. Colorectal Cancer  This type of cancer can be detected and often prevented.  Routine colorectal cancer screening usually begins at 64 years of age and continues through 64 years of age.  Your health care provider may recommend screening at an earlier age if you have risk factors for colon cancer.  Your health care provider may also recommend using home test kits to check for hidden blood in the stool.  A small camera at the end of a tube can be used to examine your colon directly (sigmoidoscopy or colonoscopy). This is done to check for the earliest forms of colorectal cancer.  Routine screening usually begins at age 15.  Direct examination of the colon should be repeated every 5-10 years through 64 years of age. However, you may need to be screened more often if early forms of precancerous polyps or  small growths are found. Skin Cancer  Check your skin from head to toe regularly.  Tell your health care provider about any new moles or changes in moles, especially if there is a change in a mole's shape or color.  Also tell your health care provider if you have a mole that is larger than the size of a pencil eraser.  Always use sunscreen. Apply sunscreen liberally and repeatedly throughout the day.  Protect yourself by wearing long sleeves, pants, a wide-brimmed hat, and sunglasses whenever you are outside. HEART DISEASE, DIABETES, AND HIGH BLOOD PRESSURE   High blood pressure causes heart disease and increases the risk of stroke. High blood pressure is more likely to develop in:  People who have blood pressure in the high end of the normal range (130-139/85-89 mm Hg).  People who are overweight or obese.  People who are African American.  If you are 18-39 years of age, have your blood pressure checked every  3-5 years. If you are 40 years of age or older, have your blood pressure checked every year. You should have your blood pressure measured twice--once when you are at a hospital or clinic, and once when you are not at a hospital or clinic. Record the average of the two measurements. To check your blood pressure when you are not at a hospital or clinic, you can use:  An automated blood pressure machine at a pharmacy.  A home blood pressure monitor.  If you are between 55 years and 79 years old, ask your health care provider if you should take aspirin to prevent strokes.  Have regular diabetes screenings. This involves taking a blood sample to check your fasting blood sugar level.  If you are at a normal weight and have a low risk for diabetes, have this test once every three years after 64 years of age.  If you are overweight and have a high risk for diabetes, consider being tested at a younger age or more often. PREVENTING INFECTION  Hepatitis B  If you have a higher risk for hepatitis B, you should be screened for this virus. You are considered at high risk for hepatitis B if:  You were born in a country where hepatitis B is common. Ask your health care provider which countries are considered high risk.  Your parents were born in a high-risk country, and you have not been immunized against hepatitis B (hepatitis B vaccine).  You have HIV or AIDS.  You use needles to inject street drugs.  You live with someone who has hepatitis B.  You have had sex with someone who has hepatitis B.  You get hemodialysis treatment.  You take certain medicines for conditions, including cancer, organ transplantation, and autoimmune conditions. Hepatitis C  Blood testing is recommended for:  Everyone born from 1945 through 1965.  Anyone with known risk factors for hepatitis C. Sexually transmitted infections (STIs)  You should be screened for sexually transmitted infections (STIs) including  gonorrhea and chlamydia if:  You are sexually active and are younger than 64 years of age.  You are older than 64 years of age and your health care provider tells you that you are at risk for this type of infection.  Your sexual activity has changed since you were last screened and you are at an increased risk for chlamydia or gonorrhea. Ask your health care provider if you are at risk.  If you do not have HIV, but are at risk, it may be   recommended that you take a prescription medicine daily to prevent HIV infection. This is called pre-exposure prophylaxis (PrEP). You are considered at risk if:  You are sexually active and do not regularly use condoms or know the HIV status of your partner(s).  You take drugs by injection.  You are sexually active with a partner who has HIV. Talk with your health care provider about whether you are at high risk of being infected with HIV. If you choose to begin PrEP, you should first be tested for HIV. You should then be tested every 3 months for as long as you are taking PrEP.  PREGNANCY   If you are premenopausal and you may become pregnant, ask your health care provider about preconception counseling.  If you may become pregnant, take 400 to 800 micrograms (mcg) of folic acid every day.  If you want to prevent pregnancy, talk to your health care provider about birth control (contraception). OSTEOPOROSIS AND MENOPAUSE   Osteoporosis is a disease in which the bones lose minerals and strength with aging. This can result in serious bone fractures. Your risk for osteoporosis can be identified using a bone density scan.  If you are 65 years of age or older, or if you are at risk for osteoporosis and fractures, ask your health care provider if you should be screened.  Ask your health care provider whether you should take a calcium or vitamin D supplement to lower your risk for osteoporosis.  Menopause may have certain physical symptoms and  risks.  Hormone replacement therapy may reduce some of these symptoms and risks. Talk to your health care provider about whether hormone replacement therapy is right for you.  HOME CARE INSTRUCTIONS   Schedule regular health, dental, and eye exams.  Stay current with your immunizations.   Do not use any tobacco products including cigarettes, chewing tobacco, or electronic cigarettes.  If you are pregnant, do not drink alcohol.  If you are breastfeeding, limit how much and how often you drink alcohol.  Limit alcohol intake to no more than 1 drink per day for nonpregnant women. One drink equals 12 ounces of beer, 5 ounces of wine, or 1 ounces of hard liquor.  Do not use street drugs.  Do not share needles.  Ask your health care provider for help if you need support or information about quitting drugs.  Tell your health care provider if you often feel depressed.  Tell your health care provider if you have ever been abused or do not feel safe at home.   This information is not intended to replace advice given to you by your health care provider. Make sure you discuss any questions you have with your health care provider.   Document Released: 12/11/2010 Document Revised: 06/18/2014 Document Reviewed: 04/29/2013 [employer] Interactive Patient Education 2016 [employer]   

## 2015-10-12 NOTE — Progress Notes (Signed)
Subjective:    Patient ID: Patricia Aguirre, female    DOB: 05/22/1951, 65 y.o.   MRN: [id number]  Chief Complaint  Patient presents with  . Establish Care    CPE, not fasting, states that she was diagnosed with hepatitis a few years back and does want that added into her blood work as well diabetes check    HPI:  Patricia Aguirre is a 65 y.o. female who presents today for an annual wellness visit.   1) Health Maintenance -   Diet - Averages about 2-3 meals per day consisting of legumes, beef, some pork, vegetables, fruit and chicken; Caffeine intake of 1-2 cups daily  Exercise - No structured exercise working on getting started.    2) Preventative Exams / Immunizations:  Dental -- Up to date  Vision -- Up to date   Health Maintenance  Topic Date Due  . TETANUS/TDAP  02/24/2016 (Originally [birth year]/27/1971)  . ZOSTAVAX  10/09/2016 (Originally 02/27/2012)  . INFLUENZA VACCINE  01/10/2016  . MAMMOGRAM  03/26/2016  . PAP SMEAR  03/15/2018  . COLONOSCOPY  10/28/2023  . Hepatitis C Screening  Completed  . HIV Screening  Completed    Immunization History  Administered Date(s) Administered  . Hepatitis A, Adult 07/07/2014, 01/18/2015  . Hepatitis B, adult 07/07/2014, 09/21/2014, 01/18/2015  . Influenza-Unspecified 03/11/2013, 04/13/2014  . Pneumococcal-Unspecified 06/11/2013    No Known Allergies   Outpatient Prescriptions Prior to Visit  Medication Sig Dispense Refill  . Multiple Vitamin (MULTIVITAMIN WITH MINERALS) TABS tablet Take 1 tablet by mouth daily.    Marland Kitchen dextromethorphan-guaiFENesin (MUCINEX DM) 30-600 MG per 12 hr tablet Take 1 tablet by mouth 2 (two) times daily.    . diphenhydrAMINE (BENADRYL) 25 MG tablet Take 25 mg by mouth every 6 (six) hours as needed.    . fexofenadine (ALLEGRA) 180 MG tablet Take 180 mg by mouth daily.    Marland Kitchen losartan-hydrochlorothiazide (HYZAAR) 100-25 MG per tablet Take 1 tablet by mouth every morning.     Marland Kitchen omeprazole (PRILOSEC OTC) 20 MG  tablet Take 20 mg by mouth daily as needed (acid reflux).     . pseudoephedrine (SUDAFED) 60 MG tablet Take 60 mg by mouth every 4 (four) hours as needed for congestion.    Marland Kitchen lactulose (CHRONULAC) 10 GM/15ML solution Take 20 g by mouth at bedtime.    . naproxen sodium (ANAPROX) 220 MG tablet Take 220 mg by mouth 2 (two) times daily as needed (FOR PAIN).    . Omega 3 1000 MG CAPS Take by mouth.     No facility-administered medications prior to visit.     Past Medical History  Diagnosis Date  . Coronary artery disease   . Hypertension   . H/O hiatal hernia   . Arthritis   . Hepatitis     Hep C; dx 1 week ago 10/2113  . Wears glasses      Past Surgical History  Procedure Laterality Date  . Colonoscopy    . Colonoscopy with propofol N/A 10/27/2013    Procedure: COLONOSCOPY WITH PROPOFOL;  Surgeon: Charolett Bumpers, MD;  Location: [facility];  Service: Endoscopy;  Laterality: N/A;  . Esophagogastroduodenoscopy (egd) with propofol N/A 04/12/2014    Procedure: ESOPHAGOGASTRODUODENOSCOPY (EGD) WITH PROPOFOL;  Surgeon: Charolett Bumpers, MD;  Location: [facility];  Service: Endoscopy;  Laterality: N/A;  . Septoplasty  2005  . Tubal ligation    . Breast lumpectomy with radioactive seed localization Right 05/27/2014  Procedure: RIGHT BREAST LUMPECTOMY WITH RADIOACTIVE SEED LOCALIZATION;  Surgeon: Almond Lint, MD;  Location: [facility];  Service: General;  Laterality: Right;  . Breast surgery      breast biopsy-right, atypical cells     Family History  Problem Relation Age of Onset  . Diabetes Mellitus I Mother   . Coronary artery disease Father   . Heart disease Father      Social History   Social History  . Marital Status: Single    Spouse Name: N/A  . Number of Children: 1  . Years of Education: 14   Occupational History  . Not on file.   Social History Main Topics  . Smoking status: Former Smoker -- 0.25 packs/day for 20 years    Types: Cigarettes      Quit date: 05/25/2013  . Smokeless tobacco: Never Used  . Alcohol Use: No     Comment: last -occ   . Drug Use: No  . Sexual Activity: No     Comment: stopped-   Other Topics Concern  . Not on file   Social History Narrative   Fun: Not much fun right now.    Denies abuse and feels safe at home.     Review of Systems  Constitutional: Denies fever, chills, fatigue, or significant weight gain/loss. HENT: Head: Denies headache or neck pain Ears: Denies changes in hearing, ringing in ears, earache, drainage Nose: Denies discharge, stuffiness, itching, nosebleed, sinus pain Throat: Denies sore throat, hoarseness, dry mouth, sores, thrush Eyes: Denies loss/changes in vision, pain, redness, blurry/double vision, flashing lights Cardiovascular: Denies chest pain/discomfort, tightness, palpitations, shortness of breath with activity, difficulty lying down, swelling, sudden awakening with shortness of breath Respiratory: Denies shortness of breath, cough, sputum production, wheezing Gastrointestinal: Denies dysphasia, heartburn, change in appetite, nausea, change in bowel habits, rectal bleeding, constipation, diarrhea, yellow skin or eyes Genitourinary: Denies frequency, urgency, burning/pain, blood in urine, incontinence, change in urinary strength. Musculoskeletal: Denies muscle/joint pain, stiffness, back pain, redness or swelling of joints, trauma Skin: Denies rashes, lumps, itching, dryness, color changes, or hair/nail changes Neurological: Denies dizziness, fainting, seizures, weakness, numbness, tingling, tremor Psychiatric - Denies nervousness, stress, depression or memory loss Endocrine: Denies heat or cold intolerance, sweating, frequent urination, excessive thirst, changes in appetite Hematologic: Denies ease of bruising or bleeding     Objective:     BP 126/90 mmHg  Pulse 85  Temp(Src) 98 F (36.7 C) (Oral)  Resp 16  Ht  (1.702 m)  Wt 182 lb (82.555 kg)  BMI  28.50 kg/m2  SpO2 97% Nursing note and vital signs reviewed.  Physical Exam  Constitutional: She is oriented to person, place, and time. She appears well-developed and well-nourished.  HENT:  Head: Normocephalic.  Right Ear: Hearing, tympanic membrane, external ear and ear canal normal.  Left Ear: Hearing, tympanic membrane, external ear and ear canal normal.  Nose: Nose normal.  Mouth/Throat: Uvula is midline, oropharynx is clear and moist and mucous membranes are normal.  Eyes: Conjunctivae and EOM are normal. Pupils are equal, round, and reactive to light.  Neck: Neck supple. No JVD present. No tracheal deviation present. No thyromegaly present.  Cardiovascular: Normal rate, regular rhythm, normal heart sounds and intact distal pulses.   Pulmonary/Chest: Effort normal and breath sounds normal.  Abdominal: Soft. Bowel sounds are normal. She exhibits no distension and no mass. There is no tenderness. There is no rebound and no guarding.  Musculoskeletal: Normal range of motion. She exhibits  no edema or tenderness.  Lymphadenopathy:    She has no cervical adenopathy.  Neurological: She is alert and oriented to person, place, and time. She has normal reflexes. No cranial nerve deficit. She exhibits normal muscle tone. Coordination normal.  Skin: Skin is warm and dry.  Psychiatric: She has a normal mood and affect. Her behavior is normal. Judgment and thought content normal.       Assessment & Plan:   Problem List Items Addressed This Visit      Cardiovascular and Mediastinum   Hypertension   Relevant Medications   losartan-hydrochlorothiazide (HYZAAR) 100-25 MG tablet     Digestive   GERD (gastroesophageal reflux disease)   Relevant Medications   omeprazole (PRILOSEC OTC) 20 MG tablet     Other   Routine general medical examination at a health care facility - Primary    1) Anticipatory Guidance: Discussed importance of wearing a seatbelt while driving and not texting while  driving; changing batteries in smoke detector at least once annually; wearing suntan lotion when outside; eating a balanced and moderate diet; getting physical activity at least 30 minutes per day.  2) Immunizations / Screenings / Labs:  Declines tetanus and Zostavax. All other immunizations are up-to-date per recommendations. Obtain vitamin D for vitamin D deficiency screening. Obtain hemoglobin A1c for diabetes screening. Obtain hepatitis C RNA for hepatitis C screening. All other screenings are up-to-date per recommendations. Obtain CBC, CMET, Lipid profile and TSH.   Overall well exam with risk factors for cardiovascular disease including hypertension and overweight. Hypertension is slightly elevated today although has not been taking medications with refill of medication sent to pharmacy. Discussed importance of 5-7% of current body weight loss through physical activity and diet modification.Recommend increasing physical activity to 30 minutes of moderate level activity daily. Encourage nutritional intake that focuses on nutrient dense foods and is moderate, varied, and balanced and is low in saturated fats and processed/sugary foods. Continue other healthy lifestyle behaviors and choices. Follow-up prevention exam in 1 year. Follow-up office visit for chronic conditions in one month pending blood work.       Relevant Orders   Comprehensive metabolic panel   CBC   TSH   Lipid panel   Vitamin D (25 hydroxy)   Hepatitis C RNA quantitative   Hemoglobin A1c   Sleep disturbance   Relevant Medications   traZODone (DESYREL) 50 MG tablet

## 2015-10-12 NOTE — Assessment & Plan Note (Signed)
1) Anticipatory Guidance: Discussed importance of wearing a seatbelt while driving and not texting while driving; changing batteries in smoke detector at least once annually; wearing suntan lotion when outside; eating a balanced and moderate diet; getting physical activity at least 30 minutes per day.  2) Immunizations / Screenings / Labs:  Declines tetanus and Zostavax. All other immunizations are up-to-date per recommendations. Obtain vitamin D for vitamin D deficiency screening. Obtain hemoglobin A1c for diabetes screening. Obtain hepatitis C RNA for hepatitis C screening. All other screenings are up-to-date per recommendations. Obtain CBC, CMET, Lipid profile and TSH.   Overall well exam with risk factors for cardiovascular disease including hypertension and overweight. Hypertension is slightly elevated today although has not been taking medications with refill of medication sent to pharmacy. Discussed importance of 5-7% of current body weight loss through physical activity and diet modification.Recommend increasing physical activity to 30 minutes of moderate level activity daily. Encourage nutritional intake that focuses on nutrient dense foods and is moderate, varied, and balanced and is low in saturated fats and processed/sugary foods. Continue other healthy lifestyle behaviors and choices. Follow-up prevention exam in 1 year. Follow-up office visit for chronic conditions in one month pending blood work.

## 2015-10-13 ENCOUNTER — Ambulatory Visit (INDEPENDENT_AMBULATORY_CARE_PROVIDER_SITE_OTHER): Payer: BLUE CROSS/BLUE SHIELD | Admitting: Emergency Medicine

## 2015-10-13 ENCOUNTER — Encounter: Payer: Self-pay | Admitting: Emergency Medicine

## 2015-10-13 VITALS — BP 118/82 | HR 70 | Ht 66.0 in | Wt 184.0 lb

## 2015-10-13 DIAGNOSIS — R0600 Dyspnea, unspecified: Secondary | ICD-10-CM | POA: Diagnosis not present

## 2015-10-13 DIAGNOSIS — G479 Sleep disorder, unspecified: Secondary | ICD-10-CM

## 2015-10-13 DIAGNOSIS — J3489 Other specified disorders of nose and nasal sinuses: Secondary | ICD-10-CM | POA: Diagnosis not present

## 2015-10-13 NOTE — Assessment & Plan Note (Signed)
She feels that she's having difficulty moving air through her nose and is experiencing significant problems from thick mucus. Clear to me whether she has chronic sinus disease versus an anatomical obstruction following her prior septoplasty. Certainly allergic rhinitis may be a component of this although she does not have overt rhinorrhea. I would like to perform a CT scan of her sinuses before referring her back to ENT or starting her on nasal steroid

## 2015-10-13 NOTE — Progress Notes (Signed)
Subjective:    Patient ID: Patricia Aguirre, female    DOB: April 29, 1951, 65 y.o.   MRN: [id number]  HPI 65 year old former smoker (5-10 pk/yrs) with a history of hypertension, GERD, chronic hepatitis C on suppressive therapy. Underwent septoplasty in 2005 for deviated septum by Dr Ezzard Standing. Has also been given dx of seasonal allergies and drainage and COPD (no PFT on record).   She presents today for evaluation of persistent mild dyspnea, R ear and sinus pressure and congestion. The mucous is thick and seems to be what bothers her the most. Seems to be worse when she is laying supine, gives her trouble when sleeping. She has pain that goes around from chest to back. She coughs up thick mucous, reliably happens at night. Gets on her throat, no real rhinorrhea.   Difficulty falling asleep. Has to get up to clear nasal passages. She has been told that she snores less than she used to before the septoplasty.    Review of Systems  Constitutional: Negative for fever and unexpected weight change.  HENT: Positive for ear pain. Negative for congestion, dental problem, nosebleeds, postnasal drip, rhinorrhea, sinus pressure, sneezing, sore throat and trouble swallowing.   Eyes: Negative for redness and itching.  Respiratory: Positive for cough and shortness of breath. Negative for chest tightness and wheezing.   Cardiovascular: Negative for palpitations and leg swelling.  Gastrointestinal: Negative for nausea and vomiting.  Genitourinary: Negative for dysuria.  Musculoskeletal: Positive for back pain. Negative for joint swelling.  Skin: Negative for rash.  Neurological: Negative for headaches.  Hematological: Does not bruise/bleed easily.  Psychiatric/Behavioral: Negative for dysphoric mood. The patient is not nervous/anxious.     Past Medical History  Diagnosis Date  . Coronary artery disease   . Hypertension   . H/O hiatal hernia   . Arthritis   . Hepatitis     Hep C; dx 1 week ago 10/2113  .  Wears glasses      Family History  Problem Relation Age of Onset  . Diabetes Mellitus I Mother   . Coronary artery disease Father   . Heart disease Father      Social History   Social History  . Marital Status: Single    Spouse Name: N/A  . Number of Children: 1  . Years of Education: 14   Occupational History  . Not on file.   Social History Main Topics  . Smoking status: Former Smoker -- 0.25 packs/day for 20 years    Types: Cigarettes    Quit date: 05/25/2013  . Smokeless tobacco: Never Used  . Alcohol Use: No     Comment: last -occ   . Drug Use: No  . Sexual Activity: No     Comment: stopped-   Other Topics Concern  . Not on file   Social History Narrative   Fun: Not much fun right now.    Denies abuse and feels safe at home.      No Known Allergies   Outpatient Prescriptions Prior to Visit  Medication Sig Dispense Refill  . losartan-hydrochlorothiazide (HYZAAR) 100-25 MG tablet Take 1 tablet by mouth every morning. 90 tablet 1  . Multiple Vitamin (MULTIVITAMIN WITH MINERALS) TABS tablet Take 1 tablet by mouth daily.    Marland Kitchen omeprazole (PRILOSEC OTC) 20 MG tablet Take 1 tablet (20 mg total) by mouth daily as needed (acid reflux). 90 tablet 1  . traZODone (DESYREL) 50 MG tablet Take 1-2 tablets (50-100 mg total) by mouth at  bedtime as needed for sleep. 30 tablet 0   No facility-administered medications prior to visit.         Objective:   Physical Exam Filed Vitals:   10/13/15 1420  BP: 118/82  Pulse: 70  Height: 5\' 6"  (1.676 m)  Weight: 184 lb (83.462 kg)  SpO2: 98%   Gen: Pleasant, overweight woman, in no distress,  normal affect  ENT: No lesions,  mouth clear,  oropharynx clear, no postnasal drip, some apparent nasal obstruction but she is able to move air  Neck: No JVD, no TMG, no carotid bruits  Lungs: No use of accessory muscles, clear without rales or rhonchi  Cardiovascular: RRR, heart sounds normal, no murmur or gallops, no peripheral  edema  Musculoskeletal: No deformities, no cyanosis or clubbing  Neuro: alert, non focal  Skin: Warm, no lesions or rashes   03/15/14 --  COMPARISON: None.  FINDINGS: There is mild bibasilar atelectatic change, slightly more on the right than on the left. Elsewhere lungs are clear. Heart size and pulmonary vascularity are normal. No adenopathy. No pneumothorax. No bone lesions.  IMPRESSION: Mild bibasilar atelectasis. No edema or consolidation.     Assessment & Plan:  Sleep disturbance Some of her symptoms are reminiscent of obstructive sleep apnea. She believes that much of her sleep disturbance is related to mucous, nasal obstruction, drainage. For now we will concentrate on her dyspnea and sinus symptoms but I believe at some point she will need a polysomnogram.  Nasal obstruction She feels that she's having difficulty moving air through her nose and is experiencing significant problems from thick mucus. Clear to me whether she has chronic sinus disease versus an anatomical obstruction following her prior septoplasty. Certainly allergic rhinitis may be a component of this although she does not have overt rhinorrhea. I would like to perform a CT scan of her sinuses before referring her back to ENT or starting her on nasal steroid  Dyspnea Difficulties out whether this is largely due to her nasal obstruction and upper airway and sinus symptoms versus possible lower airways disease. She does have a history of tobacco use and is at some risk for COPD. She has in fact been given this diagnosis before by her other physicians. I would like to perform pulmonary function testing to determine definitively whether this is the case.

## 2015-10-13 NOTE — Patient Instructions (Signed)
We will perform a CT scan of the sinuses We will perform full pulmonary function testing We will consider a possible sleep study at some point in the future. We will reconsider the subject at our next visit Follow with Dr Delton CoombesByrum next available after testing has been completed to review the results

## 2015-10-13 NOTE — Assessment & Plan Note (Signed)
Difficulties out whether this is largely due to her nasal obstruction and upper airway and sinus symptoms versus possible lower airways disease. She does have a history of tobacco use and is at some risk for COPD. She has in fact been given this diagnosis before by her other physicians. I would like to perform pulmonary function testing to determine definitively whether this is the case.

## 2015-10-13 NOTE — Assessment & Plan Note (Signed)
Some of her symptoms are reminiscent of obstructive sleep apnea. She believes that much of her sleep disturbance is related to mucous, nasal obstruction, drainage. For now we will concentrate on her dyspnea and sinus symptoms but I believe at some point she will need a polysomnogram.

## 2015-10-14 ENCOUNTER — Other Ambulatory Visit (INDEPENDENT_AMBULATORY_CARE_PROVIDER_SITE_OTHER): Payer: BLUE CROSS/BLUE SHIELD

## 2015-10-14 DIAGNOSIS — Z0000 Encounter for general adult medical examination without abnormal findings: Secondary | ICD-10-CM | POA: Diagnosis not present

## 2015-10-14 LAB — LIPID PANEL
CHOLESTEROL: 201 mg/dL — AB (ref 0–200)
HDL: 58.5 mg/dL (ref >39.00)
LDL Cholesterol: 111 mg/dL — ABNORMAL HIGH (ref 0–99)
NonHDL: 142.14
TRIGLYCERIDES: 154 mg/dL — AB (ref 0.0–149.0)
Total CHOL/HDL Ratio: 3
VLDL: 30.8 mg/dL (ref 0.0–40.0)

## 2015-10-14 LAB — COMPREHENSIVE METABOLIC PANEL
ALT: 23 U/L (ref 0–35)
AST: 30 U/L (ref 0–37)
Albumin: 4.4 g/dL (ref 3.5–5.2)
Alkaline Phosphatase: 82 U/L (ref 39–117)
BILIRUBIN TOTAL: 0.6 mg/dL (ref 0.2–1.2)
BUN: 16 mg/dL (ref 6–23)
CO2: 25 mEq/L (ref 19–32)
CREATININE: 1.07 mg/dL (ref 0.40–1.20)
Calcium: 9.7 mg/dL (ref 8.4–10.5)
Chloride: 104 mEq/L (ref 96–112)
GFR: 66.47 mL/min (ref >60.00)
Glucose, Bld: 110 mg/dL — ABNORMAL HIGH (ref 70–99)
POTASSIUM: 3.7 meq/L (ref 3.5–5.1)
Sodium: 138 mEq/L (ref 135–145)
TOTAL PROTEIN: 8.6 g/dL — AB (ref 6.0–8.3)

## 2015-10-14 LAB — CBC
HCT: 40.5 % (ref 36.0–46.0)
Hemoglobin: 13.5 g/dL (ref 12.0–15.0)
MCHC: 33.5 g/dL (ref 30.0–36.0)
MCV: 91.2 fl (ref 78.0–100.0)
Platelets: 192 10*3/uL (ref 150.0–400.0)
RBC: 4.44 Mil/uL (ref 3.87–5.11)
RDW: 13.9 % (ref 11.5–15.5)
WBC: 9.3 10*3/uL (ref 4.0–10.5)

## 2015-10-14 LAB — HEMOGLOBIN A1C: Hgb A1c MFr Bld: 6.3 % (ref 4.6–6.5)

## 2015-10-14 LAB — TSH: TSH: 2.17 u[IU]/mL (ref 0.35–4.50)

## 2015-10-14 LAB — VITAMIN D 25 HYDROXY (VIT D DEFICIENCY, FRACTURES): VITD: 15.72 ng/mL — ABNORMAL LOW (ref 30.00–100.00)

## 2015-10-18 ENCOUNTER — Telehealth: Payer: Self-pay | Admitting: Family

## 2015-10-18 LAB — HEPATITIS C RNA QUANTITATIVE: HCV Quantitative: NOT DETECTED IU/mL (ref <15)

## 2015-10-18 NOTE — Telephone Encounter (Signed)
Please inform patient that her blood work shows that her hepatitis C is non-detectable and negative. Her kidney function, liver function, electrolytes, thyroid function, and white/red blood cells are within the normal limits. Her vitamin D is low with the recommendation of starting Vitamin D3 2000 units daily which is available over the counter. Lastly her cholesterol is slightly elevated which increases her risk for coronary artery disease. Therefore the recommendation is to start cholesterol medication in addition to nutrition and lifestyle changes. Decreasing saturated fats and processed/sugary foods should help. If she is interested in the cholesterol medication I will send it in. Her A1c is 6.3 indicating pre-diabetes. No medication is needed for this with the exception of similar lifestyle changes and physical activity.

## 2015-10-19 ENCOUNTER — Ambulatory Visit (INDEPENDENT_AMBULATORY_CARE_PROVIDER_SITE_OTHER)
Admission: RE | Admit: 2015-10-19 | Discharge: 2015-10-19 | Disposition: A | Discharge status: Home / Self Care | Payer: BLUE CROSS/BLUE SHIELD | Source: Ambulatory Visit | Attending: Emergency Medicine | Admitting: Emergency Medicine

## 2015-10-19 ENCOUNTER — Ambulatory Visit (HOSPITAL_COMMUNITY)
Admission: RE | Admit: 2015-10-19 | Discharge: 2015-10-19 | Disposition: A | Discharge status: Home / Self Care | Payer: BLUE CROSS/BLUE SHIELD | Source: Ambulatory Visit | Attending: Emergency Medicine | Admitting: Emergency Medicine

## 2015-10-19 ENCOUNTER — Telehealth: Payer: Self-pay | Admitting: Emergency Medicine

## 2015-10-19 DIAGNOSIS — J3489 Other specified disorders of nose and nasal sinuses: Secondary | ICD-10-CM

## 2015-10-19 DIAGNOSIS — R0600 Dyspnea, unspecified: Secondary | ICD-10-CM

## 2015-10-19 NOTE — Telephone Encounter (Signed)
LVM for pt to call back.

## 2015-10-19 NOTE — Telephone Encounter (Signed)
[facility] Call Report for CT Sinus - Sinus cavity clear but radiologist recommends Dental Consultation.   IMPRESSION: No acute abnormality of the paranasal sinuses is noted. No findings to correspond with the patient's given clinical symptomatology are seen.  Changes in the posterior aspect of the right mandible of uncertain etiology. This may represent sequelae from a prior dental extraction. Correlation with the physical examination is recommended and dental consultation recommended as well.  Please advise Dr Delton CoombesByrum. Thanks.

## 2015-10-20 NOTE — Telephone Encounter (Signed)
Pt meant to return call to [facility] - call transferred to [facility] x [phone]

## 2015-10-20 NOTE — Telephone Encounter (Signed)
Please let her know the results - no evidence chronic sinusitis, but there may be some changes associated with prior dental work. She needs to follow with her dentist to review these results and decide their importance.

## 2015-10-20 NOTE — Telephone Encounter (Signed)
Pt aware of result. Please in med for cholesterol as well.

## 2015-10-20 NOTE — Telephone Encounter (Signed)
Patient is calling back, CB [phone].

## 2015-10-20 NOTE — Telephone Encounter (Signed)
Spoke with pt. She is aware of results. She wants to know what the next step is from here.

## 2015-10-21 MED ORDER — PRAVASTATIN SODIUM 20 MG PO TABS: 20.0000 mg | ORAL_TABLET | Freq: Every day | ORAL | Status: DC

## 2015-10-21 NOTE — Addendum Note (Signed)
Addended by: Jeanine LuzALONE, GREGORY D on: 10/21/2015 12:28 PM   Modules accepted: Orders

## 2015-10-21 NOTE — Telephone Encounter (Signed)
Medication sent to pharmacy  

## 2015-11-02 ENCOUNTER — Encounter (HOSPITAL_COMMUNITY): Payer: BLUE CROSS/BLUE SHIELD

## 2015-12-26 ENCOUNTER — Ambulatory Visit (INDEPENDENT_AMBULATORY_CARE_PROVIDER_SITE_OTHER): Payer: BLUE CROSS/BLUE SHIELD | Admitting: Emergency Medicine

## 2015-12-26 ENCOUNTER — Encounter: Payer: Self-pay | Admitting: Emergency Medicine

## 2015-12-26 VITALS — BP 132/82 | HR 67 | Ht 66.0 in | Wt 183.0 lb

## 2015-12-26 DIAGNOSIS — J3489 Other specified disorders of nose and nasal sinuses: Secondary | ICD-10-CM

## 2015-12-26 DIAGNOSIS — J342 Deviated nasal septum: Secondary | ICD-10-CM

## 2015-12-26 DIAGNOSIS — R0600 Dyspnea, unspecified: Secondary | ICD-10-CM | POA: Diagnosis not present

## 2015-12-26 DIAGNOSIS — G479 Sleep disorder, unspecified: Secondary | ICD-10-CM | POA: Diagnosis not present

## 2015-12-26 MED ORDER — FLUTICASONE PROPIONATE 50 MCG/ACT NA SUSP: 2.0000 | Freq: Every day | NASAL | Status: DC

## 2015-12-26 MED ORDER — LORATADINE 10 MG PO TABS: 10.0000 mg | ORAL_TABLET | Freq: Every day | ORAL | Status: DC

## 2015-12-26 NOTE — Assessment & Plan Note (Signed)
Will need sleep study to evaluate for OSA after sinus issues resolved.

## 2015-12-26 NOTE — Assessment & Plan Note (Signed)
Continued nasal stuffiness/ obstruction per right nare Normal CT Sinuses Plan: We will start a nasal steroids. Start Flonase( Fluticasone ). 2 puffs each nare once  daily. Start Claritin 10  once daily. Referral to ENT  Dr. Annalee GentaShoemaker or Jenne PaneBates for evaluation  for nasal obstruction. Follow up with Dr. Delton CoombesByrum at next available appointment with PFT's same day prior to appointment. Please contact office for sooner follow up if symptoms do not improve or worsen or seek emergency care

## 2015-12-26 NOTE — Progress Notes (Signed)
History of Present Illness Patricia Aguirre is a 65 y.o. female former smoker with a history of hypertension, GERD, chronic hepatitis C on suppressive therapy. Underwent septoplasty in 2005 for deviated septum by Dr Ezzard Standing. Has also been given dx of seasonal allergies and drainage and COPD (no PFT on record).    7/17/2017Follow Up Appointments:  Pt. Presents today for follow up and results of PFT's and Sinus CT scan. She has had the Sinus CT, but not the PFT's. PFT's were held due to facial swelling due to the recent oral surgery.She recently had oral surgery for extraction and bone grafting. She continues to complain of R. Sided ear pressure and congestion. Mucus is thick, similar to glue, and white to clear in color. She states there have been times it is blood tinged.She continues to have pain that radiates under her right arm. She has had an abnormal mammogram within the last year, which was needle biopsied and negative.She states that she has some pins and needles sensation in her feet that has been there for about 4 years. Sinus CT is normal. She denies chest pain, fever, orthopnea, or hemoptysis, leg or calf pain. She has original sinus surgery in 2005. She was taking Prilosec but was told by her dentist to stop and just take Tums. .  Tests:  10/19/2015: CT Sinuses IMPRESSION: No acute abnormality of the paranasal sinuses is noted. No findings to correspond with the patient's given clinical symptomatology are seen.  Changes in the posterior aspect of the right mandible of uncertain etiology. This may represent sequelae from a prior dental extraction. Correlation with the physical examination is recommended and dental consultation recommended as well.   Past medical hx Past Medical History  Diagnosis Date  . Coronary artery disease   . Hypertension   . H/O hiatal hernia   . Arthritis   . Hepatitis     Hep C; dx 1 week ago 10/2113  . Wears glasses      Past surgical hx, Family  hx, Social hx all reviewed.  Current Outpatient Prescriptions on File Prior to Visit  Medication Sig  . losartan-hydrochlorothiazide (HYZAAR) 100-25 MG tablet Take 1 tablet by mouth every morning.  . Multiple Vitamin (MULTIVITAMIN WITH MINERALS) TABS tablet Take 1 tablet by mouth daily.  Marland Kitchen omeprazole (PRILOSEC OTC) 20 MG tablet Take 1 tablet (20 mg total) by mouth daily as needed (acid reflux).  . pravastatin (PRAVACHOL) 20 MG tablet Take 1 tablet (20 mg total) by mouth daily.  . traZODone (DESYREL) 50 MG tablet Take 1-2 tablets (50-100 mg total) by mouth at bedtime as needed for sleep.   No current facility-administered medications on file prior to visit.     No Known Allergies  Review Of Systems:  Constitutional:   No  weight loss, night sweats,  Fevers, chills, fatigue, or  lassitude.  HEENT:   No headaches,  Difficulty swallowing,  Tooth/dental problems, or  Sore throat,                No sneezing, itching, ear ache ( Pressure R>L), nasal congestion ( R>L), post nasal drip,   CV:  No chest pain,  Orthopnea, PND, swelling in lower extremities, anasarca, dizziness, palpitations, syncope.   GI  No heartburn, indigestion, abdominal pain, nausea, vomiting, diarrhea, change in bowel habits, loss of appetite, bloody stools.   Resp: + shortness of breath with exertion not  at rest.  + excess mucus, no productive cough,  No non-productive cough,  No coughing up of blood.  No change in color of mucus.  No wheezing.  No chest wall deformity  Skin: no rash or lesions.  GU: no dysuria, change in color of urine, no urgency or frequency.  No flank pain, no hematuria   MS:  No joint pain or swelling.  No decreased range of motion.  No back pain.  Psych:  No change in mood or affect. No depression or anxiety.  No memory loss.   Vital Signs BP 132/82 mmHg  Pulse 67  Ht 5\' 6"  (1.676 m)  Wt 183 lb (83.008 kg)  BMI 29.55 kg/m2  SpO2 99%   Physical Exam:  General- No distress,  A&Ox3,  pleasant ENT: No sinus tenderness, TM clear, pale nasal mucosa, no oral exudate,+ post nasal drip, no LAN Cardiac: S1, S2, regular rate and rhythm, no murmur Chest: No wheeze/ rales/ dullness; diminished bilaterally per bases. no accessory muscle use, no nasal flaring, no sternal retractions Abd.: Soft Non-tender Ext: No clubbing cyanosis, edema Neuro:  normal strength Skin: No rashes, warm and dry Psych: normal mood and behavior   Assessment/Plan  Nasal obstruction Continued nasal stuffiness/ obstruction per right nare Normal CT Sinuses Plan: We will start a nasal steroids. Start Flonase( Fluticasone ). 2 puffs each nare once  daily. Start Claritin 10  once daily. Referral to ENT  Dr. Annalee GentaShoemaker or Jenne PaneBates for evaluation  for nasal obstruction. Follow up with Dr. Delton CoombesByrum at next available appointment with PFT's same day prior to appointment. Please contact office for sooner follow up if symptoms do not improve or worsen or seek emergency care    Dyspnea Needs PFT's to establish diagnosis. Dyspnea Plan: PFT's prior to next available office visit with Dr. Delton CoombesByrum . Please contact office for sooner follow up if symptoms do not improve or worsen or seek emergency care   Sleep disturbance Will need sleep study to evaluate for OSA after sinus issues resolved.     Bevelyn NgoSarah F Groce, NP 12/26/2015  4:32 PM     Attending Note:  I have examined patient, reviewed labs, studies and notes. I have discussed the case with Saralyn PilarS Groce, and I agree with the data and plans as amended above. We will plan to reorder her pulmonary function testing. Her CT scan of the sinuses was reassuring, I will start a nasal steroid and refer her to see ENT. She will need a sleep study at some point in future.  Levy Pupaobert Byrum, MD, PhD 12/26/2015, 5:59 PM [facility] [phone] or if no answer [phone]

## 2015-12-26 NOTE — Patient Instructions (Addendum)
We will re-schedule your Pulmonary Function Tests. We will start a nasal steroids. Start Flonase( Fluticasone ). 2 puffs each nare once  daily. Start Claritin 10  once daily. Referral to ENT  Dr. Annalee GentaShoemaker or Jenne PaneBates for evaluation  for nasal obstruction. Follow up with Dr. Delton CoombesByrum at next available appointment with PFT's same day prior to appointment. Please contact office for sooner follow up if symptoms do not improve or worsen or seek emergency care

## 2015-12-26 NOTE — Assessment & Plan Note (Signed)
Needs PFT's to establish diagnosis. Dyspnea Plan: PFT's prior to next available office visit with Dr. Delton CoombesByrum . Please contact office for sooner follow up if symptoms do not improve or worsen or seek emergency care

## 2016-02-09 ENCOUNTER — Other Ambulatory Visit: Payer: Self-pay | Admitting: Family

## 2016-02-14 ENCOUNTER — Ambulatory Visit (INDEPENDENT_AMBULATORY_CARE_PROVIDER_SITE_OTHER): Payer: BLUE CROSS/BLUE SHIELD | Admitting: Emergency Medicine

## 2016-02-14 ENCOUNTER — Encounter: Payer: Self-pay | Admitting: Emergency Medicine

## 2016-02-14 DIAGNOSIS — R0600 Dyspnea, unspecified: Secondary | ICD-10-CM | POA: Diagnosis not present

## 2016-02-14 DIAGNOSIS — J3489 Other specified disorders of nose and nasal sinuses: Secondary | ICD-10-CM | POA: Diagnosis not present

## 2016-02-14 DIAGNOSIS — R0982 Postnasal drip: Secondary | ICD-10-CM | POA: Insufficient documentation

## 2016-02-14 LAB — PULMONARY FUNCTION TEST
DL/VA % pred: 105 %
DL/VA: 5.24 ml/min/mmHg/L
DLCO COR % PRED: 77 %
DLCO COR: 20.31 ml/min/mmHg
DLCO unc % pred: 79 %
DLCO unc: 20.79 ml/min/mmHg
FEF 25-75 POST: 3.44 L/s
FEF 25-75 Pre: 3.55 L/sec
FEF2575-%CHANGE-POST: -3 %
FEF2575-%PRED-POST: 169 %
FEF2575-%PRED-PRE: 174 %
FEV1-%CHANGE-POST: 0 %
FEV1-%Pred-Post: 100 %
FEV1-%Pred-Pre: 101 %
FEV1-POST: 2.14 L
FEV1-Pre: 2.15 L
FEV1FVC-%CHANGE-POST: 2 %
FEV1FVC-%Pred-Pre: 111 %
FEV6-%CHANGE-POST: -3 %
FEV6-%PRED-PRE: 92 %
FEV6-%Pred-Post: 89 %
FEV6-PRE: 2.44 L
FEV6-Post: 2.36 L
FEV6FVC-%Pred-Post: 103 %
FEV6FVC-%Pred-Pre: 103 %
FVC-%Change-Post: -3 %
FVC-%PRED-POST: 86 %
FVC-%PRED-PRE: 89 %
FVC-PRE: 2.44 L
FVC-Post: 2.36 L
POST FEV1/FVC RATIO: 91 %
POST FEV6/FVC RATIO: 100 %
PRE FEV6/FVC RATIO: 100 %
Pre FEV1/FVC ratio: 88 %
RV % pred: 75 %
RV: 1.62 L
TLC % PRED: 77 %
TLC: 4.09 L

## 2016-02-14 NOTE — Progress Notes (Signed)
History of Present Illness Patricia Aguirre is a 65 y.o. female former smoker with a history of hypertension, GERD, chronic hepatitis C on suppressive therapy. Underwent septoplasty in 2005 for deviated septum by Dr Ezzard Standing. Has also been given dx of seasonal allergies and drainage and COPD (no PFT on record).    12/26/15 --  Pt. Presents today for follow up and results of PFT's and Sinus CT scan. She has had the Sinus CT, but not the PFT's. PFT's were held due to facial swelling due to the recent oral surgery.She recently had oral surgery for extraction and bone grafting. She continues to complain of R. Sided ear pressure and congestion. Mucus is thick, similar to glue, and white to clear in color. She states there have been times it is blood tinged.She continues to have pain that radiates under her right arm. She has had an abnormal mammogram within the last year, which was needle biopsied and negative.She states that she has some pins and needles sensation in her feet that has been there for about 4 years. Sinus CT is normal. She denies chest pain, fever, orthopnea, or hemoptysis, leg or calf pain. She has original sinus surgery in 2005. She was taking Prilosec but was told by her dentist to stop and just take Tums. .  ROV 02/14/16 -- This follow-up visit for history of tobacco use, dyspnea nasal obstruction, presumed COPD. She has seen ENT Dr Jenne Pane. She is on nasal steroid. Her PFT were done today and I have reviewed, show normal airflows, slightly decreased volumes, decreased DLCO that corrects to normal for [place]. She does have tobacco history about 10 pack-yrs, doesn't meet criteria for LDCT.    Tests:  10/19/2015: CT Sinuses IMPRESSION: No acute abnormality of the paranasal sinuses is noted. No findings to correspond with the patient's given clinical symptomatology are seen.  Changes in the posterior aspect of the right mandible of uncertain etiology. This may represent sequelae from a prior  dental extraction. Correlation with the physical examination is recommended and dental consultation recommended as well.   Past medical hx Past Medical History:  Diagnosis Date  . Arthritis   . Coronary artery disease   . H/O hiatal hernia   . Hepatitis    Hep C; dx 1 week ago 10/2113  . Hypertension   . Wears glasses      Past surgical hx, Family hx, Social hx all reviewed.  Current Outpatient Prescriptions on File Prior to Visit  Medication Sig  . fluticasone (FLONASE) 50 MCG/ACT nasal spray Place 2 sprays into both nostrils daily.  Marland Kitchen loratadine (CLARITIN) 10 MG tablet Take 1 tablet (10 mg total) by mouth daily.  Marland Kitchen losartan-hydrochlorothiazide (HYZAAR) 100-25 MG tablet Take 1 tablet by mouth every morning.  . Multiple Vitamin (MULTIVITAMIN WITH MINERALS) TABS tablet Take 1 tablet by mouth daily.  . traZODone (DESYREL) 50 MG tablet Take 1-2 tablets (50-100 mg total) by mouth at bedtime as needed for sleep.  Marland Kitchen omeprazole (PRILOSEC OTC) 20 MG tablet Take 1 tablet (20 mg total) by mouth daily as needed (acid reflux). (Patient not taking: Reported on 02/14/2016)  . pravastatin (PRAVACHOL) 20 MG tablet TAKE 1 TABLET EVERY DAY (Patient not taking: Reported on 02/14/2016)   No current facility-administered medications on file prior to visit.      No Known Allergies  Review Of Systems:  Constitutional:   No  weight loss, night sweats,  Fevers, chills, fatigue, or  lassitude.  HEENT:   No headaches,  Difficulty swallowing,  Tooth/dental problems, or  Sore throat,                No sneezing, itching, ear ache ( Pressure R>L), nasal congestion ( R>L), post nasal drip,   CV:  No chest pain,  Orthopnea, PND, swelling in lower extremities, anasarca, dizziness, palpitations, syncope.   GI  No heartburn, indigestion, abdominal pain, nausea, vomiting, diarrhea, change in bowel habits, loss of appetite, bloody stools.   Resp: + shortness of breath with exertion not  at rest.  + excess mucus,  no productive cough,  No non-productive cough,  No coughing up of blood.  No change in color of mucus.  No wheezing.  No chest wall deformity  Skin: no rash or lesions.  GU: no dysuria, change in color of urine, no urgency or frequency.  No flank pain, no hematuria   MS:  No joint pain or swelling.  No decreased range of motion.  No back pain.  Psych:  No change in mood or affect. No depression or anxiety.  No memory loss.   Vital Signs BP 122/80 (BP Location: Left Arm, Cuff Size: Normal)   Pulse 69   Ht 5' 5.5" (1.664 m)   Wt 183 lb (83 kg)   SpO2 97%   BMI 29.99 kg/m    Physical Exam:  General- No distress,  A&Ox3, pleasant ENT: No sinus tenderness, TM clear, pale nasal mucosa, no oral exudate,no LAN Cardiac: S1, S2, regular rate and rhythm, no murmur Chest: No wheeze/ rales/ dullness; diminished bilaterally per bases. no accessory muscle use, no nasal flaring, no sternal retractions Abd.: Soft Non-tender Ext: No clubbing cyanosis, edema Neuro:  normal strength Skin: No rashes, warm and dry Psych: normal mood and behavior   A/P: Dyspnea Suspect related to upper airway and nasal obstruction. No evidence for COPD on pulmonary function testing.  Nasal obstruction Following with ENT. Continue nasal steroid      Levy Pupaobert Hatim Homann, MD, PhD 02/14/2016, 4:52 PM [facility] [phone] or if no answer [phone]

## 2016-02-14 NOTE — Assessment & Plan Note (Signed)
Following with ENT. Continue nasal steroid

## 2016-02-14 NOTE — Assessment & Plan Note (Signed)
Suspect related to upper airway and nasal obstruction. No evidence for COPD on pulmonary function testing.

## 2016-02-14 NOTE — Patient Instructions (Signed)
Your pulmonary function testing does not show any significant evidence for COPD Please continue fluticasone nasal spray Your smoking history does not put you into the group of patients that showed benefit from lung cancer screening scans Follow with Dr Delton CoombesByrum if needed

## 2016-04-09 ENCOUNTER — Encounter: Payer: Self-pay | Admitting: Family

## 2016-04-09 ENCOUNTER — Ambulatory Visit (INDEPENDENT_AMBULATORY_CARE_PROVIDER_SITE_OTHER): Payer: BLUE CROSS/BLUE SHIELD | Admitting: Family

## 2016-04-09 ENCOUNTER — Other Ambulatory Visit: Payer: BLUE CROSS/BLUE SHIELD

## 2016-04-09 VITALS — BP 124/88 | HR 83 | Temp 97.8°F | Resp 16 | Ht 65.5 in | Wt 184.0 lb

## 2016-04-09 DIAGNOSIS — R8290 Unspecified abnormal findings in urine: Secondary | ICD-10-CM | POA: Diagnosis not present

## 2016-04-09 LAB — POCT URINALYSIS DIPSTICK
Bilirubin, UA: NEGATIVE
GLUCOSE UA: NEGATIVE
KETONES UA: NEGATIVE
Nitrite, UA: POSITIVE
Spec Grav, UA: 1.025
Urobilinogen, UA: NEGATIVE
pH, UA: 6

## 2016-04-09 MED ORDER — SULFAMETHOXAZOLE-TRIMETHOPRIM 800-160 MG PO TABS: 1.0000 | ORAL_TABLET | Freq: Two times a day (BID) | ORAL | 0 refills | Status: DC

## 2016-04-09 NOTE — Patient Instructions (Signed)
Thank you for choosing [facility].  SUMMARY AND INSTRUCTIONS:   Medication:  Please start the Bactrim.   Your prescription(s) have been submitted to your pharmacy or been printed and provided for you. Please take as directed and contact our office if you believe you are having problem(s) with the medication(s) or have any questions.  Follow up:  If your symptoms worsen or fail to improve, please contact our office for further instruction, or in case of emergency go directly to the emergency room at the closest medical facility.    Urinary Tract Infection Urinary tract infections (UTIs) can develop anywhere along your urinary tract. Your urinary tract is your body's drainage system for removing wastes and extra water. Your urinary tract includes two kidneys, two ureters, a bladder, and a urethra. Your kidneys are a pair of bean-shaped organs. Each kidney is about the size of your fist. They are located below your ribs, one on each side of your spine. CAUSES Infections are caused by microbes, which are microscopic organisms, including fungi, viruses, and bacteria. These organisms are so small that they can only be seen through a microscope. Bacteria are the microbes that most commonly cause UTIs. SYMPTOMS  Symptoms of UTIs may vary by age and gender of the patient and by the location of the infection. Symptoms in young women typically include a frequent and intense urge to urinate and a painful, burning feeling in the bladder or urethra during urination. Older women and men are more likely to be tired, shaky, and weak and have muscle aches and abdominal pain. A fever may mean the infection is in your kidneys. Other symptoms of a kidney infection include pain in your back or sides below the ribs, nausea, and vomiting. DIAGNOSIS To diagnose a UTI, your caregiver will ask you about your symptoms. Your caregiver will also ask you to provide a urine sample. The urine sample will be tested for  bacteria and white blood cells. White blood cells are made by your body to help fight infection. TREATMENT  Typically, UTIs can be treated with medication. Because most UTIs are caused by a bacterial infection, they usually can be treated with the use of antibiotics. The choice of antibiotic and length of treatment depend on your symptoms and the type of bacteria causing your infection. HOME CARE INSTRUCTIONS  If you were prescribed antibiotics, take them exactly as your caregiver instructs you. Finish the medication even if you feel better after you have only taken some of the medication.  Drink enough water and fluids to keep your urine clear or pale yellow.  Avoid caffeine, tea, and carbonated beverages. They tend to irritate your bladder.  Empty your bladder often. Avoid holding urine for long periods of time.  Empty your bladder before and after sexual intercourse.  After a bowel movement, women should cleanse from front to back. Use each tissue only once. SEEK MEDICAL CARE IF:   You have back pain.  You develop a fever.  Your symptoms do not begin to resolve within 3 days. SEEK IMMEDIATE MEDICAL CARE IF:   You have severe back pain or lower abdominal pain.  You develop chills.  You have nausea or vomiting.  You have continued burning or discomfort with urination. MAKE SURE YOU:   Understand these instructions.  Will watch your condition.  Will get help right away if you are not doing well or get worse.   This information is not intended to replace advice given to you by your  health care provider. Make sure you discuss any questions you have with your health care provider.   Document Released: 03/07/2005 Document Revised: 02/16/2015 Document Reviewed: 07/06/2011 Elsevier Interactive Patient Education [employer]2016 [employer] Inc.

## 2016-04-09 NOTE — Progress Notes (Signed)
Subjective:    Patient ID: Patricia Aguirre, female    DOB: [birth year], 65 y.o.   MRN: [id number]  Chief Complaint  Patient presents with  . Back Pain    lower back and pelvic pain, urine odor, urinary urgency    HPI:  Patricia Aguirre is a 65 y.o. female who  has a past medical history of Arthritis; Coronary artery disease; H/O hiatal hernia; Hepatitis; Hypertension; and Wears glasses. and presents today for an acute office visit.  This is a new problem. Associated symptoms of lower back pain, urine odor, and urinary urgency been going on for approximately 1 month. No fevers. Modifying factors include cranberry juice which did not help with symptoms very much.    No Known Allergies    Outpatient Medications Prior to Visit  Medication Sig Dispense Refill  . fluticasone (FLONASE) 50 MCG/ACT nasal spray Place 2 sprays into both nostrils daily. 16 g 5  . loratadine (CLARITIN) 10 MG tablet Take 1 tablet (10 mg total) by mouth daily. 30 tablet 5  . losartan-hydrochlorothiazide (HYZAAR) 100-25 MG tablet Take 1 tablet by mouth every morning. 90 tablet 1  . Multiple Vitamin (MULTIVITAMIN WITH MINERALS) TABS tablet Take 1 tablet by mouth daily.    Marland Kitchen. omeprazole (PRILOSEC OTC) 20 MG tablet Take 1 tablet (20 mg total) by mouth daily as needed (acid reflux). 90 tablet 1  . pravastatin (PRAVACHOL) 20 MG tablet TAKE 1 TABLET EVERY DAY 90 tablet 2  . traZODone (DESYREL) 50 MG tablet Take 1-2 tablets (50-100 mg total) by mouth at bedtime as needed for sleep. 30 tablet 0   No facility-administered medications prior to visit.     Review of Systems  Constitutional: Negative for chills and fever.  Genitourinary: Positive for flank pain and urgency. Negative for dysuria, frequency and hematuria.      Objective:    BP 124/88 (BP Location: Left Arm, Patient Position: Sitting, Cuff Size: Normal)   Pulse 83   Temp 97.8 F (36.6 C) (Oral)   Resp 16   Ht 5' 5.5" (1.664 m)   Wt 184 lb (83.5 kg)   SpO2  98%   BMI 30.15 kg/m  Nursing note and vital signs reviewed.  Physical Exam  Constitutional: She is oriented to person, place, and time. She appears well-developed and well-nourished. No distress.  Cardiovascular: Normal rate, regular rhythm, normal heart sounds and intact distal pulses.   Pulmonary/Chest: Effort normal and breath sounds normal.  Abdominal: There is no CVA tenderness.  Neurological: She is alert and oriented to person, place, and time.  Skin: Skin is warm and dry.  Psychiatric: She has a normal mood and affect. Her behavior is normal. Judgment and thought content normal.       Assessment & Plan:   Problem List Items Addressed This Visit      Other   Bad odor of urine - Primary    In office urinalysis positive for leukocytes, nitrites, and hematuria. Symptoms and exam are consistent with urinary tract infection. Urine sent for culture for confirmation. Start Bactrim. Continue to hydrate well. Follow-up if symptoms worsen or do not improve.      Relevant Medications   sulfamethoxazole-trimethoprim (BACTRIM DS,SEPTRA DS) 800-160 MG tablet   Other Relevant Orders   POCT urinalysis dipstick (Completed)   Urine culture    Other Visit Diagnoses   None.      I am having [name] start on sulfamethoxazole-trimethoprim. I am also having her maintain her  multivitamin with minerals, losartan-hydrochlorothiazide, omeprazole, traZODone, loratadine, fluticasone, and pravastatin.   Meds ordered this encounter  Medications  . sulfamethoxazole-trimethoprim (BACTRIM DS,SEPTRA DS) 800-160 MG tablet    Sig: Take 1 tablet by mouth 2 (two) times daily.    Dispense:  14 tablet    Refill:  0    Order Specific Question:   Supervising Provider    Answer:   Hillard DankerRAWFORD, ELIZABETH A [[id number]]     Follow-up: Return if symptoms worsen or fail to improve.  Jeanine Luzalone, Chade Pitner, FNP

## 2016-04-09 NOTE — Assessment & Plan Note (Addendum)
In office urinalysis positive for leukocytes, nitrites, and hematuria. Symptoms and exam are consistent with urinary tract infection. Urine sent for culture for confirmation. Start Bactrim. Continue to hydrate well. Follow-up if symptoms worsen or do not improve.

## 2016-04-11 LAB — URINE CULTURE: Colony Count: >=100000

## 2016-04-18 ENCOUNTER — Other Ambulatory Visit: Payer: Self-pay | Admitting: Obstetrics and Gynecology

## 2016-04-18 DIAGNOSIS — N644 Mastodynia: Secondary | ICD-10-CM

## 2016-04-24 ENCOUNTER — Ambulatory Visit
Admission: RE | Admit: 2016-04-24 | Discharge: 2016-04-24 | Disposition: A | Discharge status: Home / Self Care | Payer: BLUE CROSS/BLUE SHIELD | Source: Ambulatory Visit | Attending: Obstetrics and Gynecology | Admitting: Obstetrics and Gynecology

## 2016-04-24 DIAGNOSIS — N644 Mastodynia: Secondary | ICD-10-CM

## 2016-05-24 ENCOUNTER — Other Ambulatory Visit: Payer: Self-pay | Admitting: Emergency Medicine

## 2016-05-24 DIAGNOSIS — J3489 Other specified disorders of nose and nasal sinuses: Secondary | ICD-10-CM

## 2016-07-01 ENCOUNTER — Other Ambulatory Visit: Payer: Self-pay | Admitting: Family

## 2016-07-01 DIAGNOSIS — I159 Secondary hypertension, unspecified: Secondary | ICD-10-CM

## 2016-11-19 ENCOUNTER — Other Ambulatory Visit: Payer: Self-pay | Admitting: Family

## 2016-11-19 DIAGNOSIS — K2100 Gastro-esophageal reflux disease with esophagitis, without bleeding: Secondary | ICD-10-CM

## 2016-12-18 ENCOUNTER — Other Ambulatory Visit: Payer: Self-pay | Admitting: Family

## 2016-12-18 DIAGNOSIS — K2100 Gastro-esophageal reflux disease with esophagitis, without bleeding: Secondary | ICD-10-CM

## 2017-01-08 ENCOUNTER — Other Ambulatory Visit: Payer: Self-pay | Admitting: Family

## 2017-01-08 DIAGNOSIS — K2100 Gastro-esophageal reflux disease with esophagitis, without bleeding: Secondary | ICD-10-CM

## 2017-01-13 ENCOUNTER — Other Ambulatory Visit: Payer: Self-pay | Admitting: Family

## 2017-01-13 DIAGNOSIS — K2100 Gastro-esophageal reflux disease with esophagitis, without bleeding: Secondary | ICD-10-CM

## 2017-02-13 DIAGNOSIS — Z0100 Encounter for examination of eyes and vision without abnormal findings: Secondary | ICD-10-CM | POA: Diagnosis not present

## 2017-02-23 ENCOUNTER — Other Ambulatory Visit: Payer: Self-pay | Admitting: Family

## 2017-02-23 DIAGNOSIS — I159 Secondary hypertension, unspecified: Secondary | ICD-10-CM

## 2017-05-02 DIAGNOSIS — Z0100 Encounter for examination of eyes and vision without abnormal findings: Secondary | ICD-10-CM | POA: Diagnosis not present

## 2017-05-24 ENCOUNTER — Telehealth: Payer: Self-pay

## 2017-05-24 NOTE — Telephone Encounter (Signed)
I called patient to ask that she bring the new patient packet to the office by Monday afternoon (121/17/18). Pt has a new patient appointment on 05/29/17.   Left a message for patient to call the office.

## 2017-05-27 NOTE — Telephone Encounter (Signed)
Patient called back stating she never received new patient packet, patient will see if packet comes today and will bring into office tomorrow.   Patient is unable to bring packet today if it comes in the mail due to other engagements.

## 2017-05-29 ENCOUNTER — Ambulatory Visit: Payer: Self-pay | Admitting: Internal Medicine

## 2017-06-07 ENCOUNTER — Ambulatory Visit: Payer: Self-pay | Admitting: Internal Medicine

## 2017-06-19 ENCOUNTER — Ambulatory Visit: Payer: Self-pay | Admitting: Internal Medicine

## 2017-06-21 ENCOUNTER — Ambulatory Visit: Payer: Self-pay | Admitting: Internal Medicine

## 2017-06-26 DIAGNOSIS — Z6829 Body mass index (BMI) 29.0-29.9, adult: Secondary | ICD-10-CM | POA: Diagnosis not present

## 2017-06-26 DIAGNOSIS — Z1231 Encounter for screening mammogram for malignant neoplasm of breast: Secondary | ICD-10-CM | POA: Diagnosis not present

## 2017-06-26 DIAGNOSIS — Z113 Encounter for screening for infections with a predominantly sexual mode of transmission: Secondary | ICD-10-CM | POA: Diagnosis not present

## 2017-06-26 DIAGNOSIS — Z01419 Encounter for gynecological examination (general) (routine) without abnormal findings: Secondary | ICD-10-CM | POA: Diagnosis not present

## 2017-06-26 DIAGNOSIS — R69 Illness, unspecified: Secondary | ICD-10-CM | POA: Diagnosis not present

## 2017-07-16 ENCOUNTER — Encounter: Payer: Self-pay | Admitting: *Deleted

## 2017-07-16 DIAGNOSIS — R69 Illness, unspecified: Secondary | ICD-10-CM | POA: Diagnosis not present

## 2017-07-17 ENCOUNTER — Ambulatory Visit (INDEPENDENT_AMBULATORY_CARE_PROVIDER_SITE_OTHER): Payer: Medicare HMO | Admitting: Internal Medicine

## 2017-07-17 ENCOUNTER — Encounter: Payer: Self-pay | Admitting: Internal Medicine

## 2017-07-17 VITALS — BP 134/90 | HR 74 | Temp 98.4°F | Resp 10 | Ht 65.5 in | Wt 173.0 lb

## 2017-07-17 DIAGNOSIS — R7303 Prediabetes: Secondary | ICD-10-CM

## 2017-07-17 DIAGNOSIS — I10 Essential (primary) hypertension: Secondary | ICD-10-CM

## 2017-07-17 DIAGNOSIS — K2100 Gastro-esophageal reflux disease with esophagitis, without bleeding: Secondary | ICD-10-CM

## 2017-07-17 DIAGNOSIS — L209 Atopic dermatitis, unspecified: Secondary | ICD-10-CM | POA: Diagnosis not present

## 2017-07-17 DIAGNOSIS — M545 Low back pain, unspecified: Secondary | ICD-10-CM

## 2017-07-17 DIAGNOSIS — M546 Pain in thoracic spine: Secondary | ICD-10-CM | POA: Diagnosis not present

## 2017-07-17 DIAGNOSIS — G8929 Other chronic pain: Secondary | ICD-10-CM | POA: Diagnosis not present

## 2017-07-17 DIAGNOSIS — J309 Allergic rhinitis, unspecified: Secondary | ICD-10-CM

## 2017-07-17 DIAGNOSIS — E782 Mixed hyperlipidemia: Secondary | ICD-10-CM | POA: Diagnosis not present

## 2017-07-17 MED ORDER — FLUTICASONE PROPIONATE 50 MCG/ACT NA SUSP: 1.0000 | Freq: Every day | NASAL | 6 refills | Status: DC

## 2017-07-17 MED ORDER — LORATADINE 10 MG PO TABS: 10.0000 mg | ORAL_TABLET | Freq: Every day | ORAL | 6 refills | Status: DC

## 2017-07-17 MED ORDER — LOSARTAN POTASSIUM-HCTZ 100-25 MG PO TABS: 1.0000 | ORAL_TABLET | Freq: Every day | ORAL | 0 refills | Status: DC

## 2017-07-17 MED ORDER — OMEPRAZOLE 20 MG PO CPDR: 20.0000 mg | DELAYED_RELEASE_CAPSULE | Freq: Every day | ORAL | 0 refills | Status: DC | PRN

## 2017-07-17 MED ORDER — MONTELUKAST SODIUM 10 MG PO TABS: 10.0000 mg | ORAL_TABLET | Freq: Every day | ORAL | 6 refills | Status: DC

## 2017-07-17 NOTE — Progress Notes (Signed)
Patient ID: Patricia Aguirre, female   DOB: 1951/03/25, 67 y.o.   MRN: [id number]   Location:  [facility] OFFICE  Provider: DR Arletha Grippe  Code Status:  Goals of Care:  Advanced Directives 05/27/2014  Does Patient Have a Medical Advance Directive? No  Would patient like information on creating a medical advance directive? No - patient declined information     Chief Complaint  Patient presents with  . Establish Care    New Patient establish care, Hep C concerns- will see Dr.Comer on 07/25/17, requesting medication to calm nerves   . Medication Refill    Ran out of prilosec, needs new rx and refill on Losartan     HPI: Patient is a 67 y.o. female seen today as a new pt. She has had several providers in the past. She is c/a cough and congestion but then describes it as "pressure" extending from right costal angle -->right neck. She gets "foamy white fluid" from right flank. 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 Path report revealed fibrocystic changes and no maligancy. Last mammogram about 1 month ago at [facility]. She c/o positional midback pain  HTN - stable on losartan hct  GERD/Hiatal hernia - sx's controlled on omeprazole  Hep C with cirrhosis - followed by ID Dr Linus Salmons. She was tx with Harvoni and completed tx in the past. F/u testing neg. She was retested last month and now (+) again. She has f/u with Dr Linus Salmons next week  Allergic rhinitis - has had septoplasty in the past. She has recurrent sinus congestion. She has tried multiple Rx oral antihistamines and nasal steroids in the past without relief. Has seen ENT in the past    Past Medical History:  Diagnosis Date  . Arthritis   . Coronary artery disease   . H/O hiatal hernia   . Hepatitis    Hep C; dx 1 week ago 10/2113  . Hypertension   . Wears glasses     Past Surgical History:  Procedure Laterality Date  . BREAST LUMPECTOMY WITH RADIOACTIVE SEED  LOCALIZATION Right 05/27/2014   Procedure: RIGHT BREAST LUMPECTOMY WITH RADIOACTIVE SEED LOCALIZATION;  Surgeon: Stark Klein, MD;  Location: [place];  Service: General;  Laterality: Right;  . BREAST SURGERY     breast biopsy-right, atypical cells  . COLONOSCOPY    . COLONOSCOPY WITH PROPOFOL N/A 10/27/2013   Procedure: COLONOSCOPY WITH PROPOFOL;  Surgeon: Garlan Fair, MD;  Location: [facility];  Service: Endoscopy;  Laterality: N/A;  . ESOPHAGOGASTRODUODENOSCOPY (EGD) WITH PROPOFOL N/A 04/12/2014   Procedure: ESOPHAGOGASTRODUODENOSCOPY (EGD) WITH PROPOFOL;  Surgeon: Garlan Fair, MD;  Location: [facility];  Service: Endoscopy;  Laterality: N/A;  . SEPTOPLASTY  2005  . TUBAL LIGATION       reports that she quit smoking about 4 years ago. Her smoking use included cigarettes. She has a 5.00 pack-year smoking history. she has never used smokeless tobacco. She reports that she does not drink alcohol or use drugs. Social History   Socioeconomic History  . Marital status: Single    Spouse name: Not on file  . Number of children: 1  . Years of education: 50  . Highest education level: Not on file  Social Needs  . Financial resource strain: Not on file  . Food insecurity - worry: Not on file  . Food insecurity - inability: Not on file  . Transportation needs - medical: Not on  file  . Transportation needs - non-medical: Not on file  Occupational History  . Not on file  Tobacco Use  . Smoking status: Former Smoker    Packs/day: 0.25    Years: 20.00    Pack years: 5.00    Types: Cigarettes    Last attempt to quit: 05/25/2013    Years since quitting: 4.1  . Smokeless tobacco: Never Used  Substance and Sexual Activity  . Alcohol use: No    Alcohol/week: 0.0 oz    Comment: last -occ   . Drug use: No  . Sexual activity: No    Comment: stopped-  Other Topics Concern  . Not on file  Social History Narrative   Current smoker- 5/day   Alcohol use -  Occasionally   Caffeine- coffee/tea   Marital- Single   House-1 story, lives alone. Pets-No   Highest level of education- [employer]   Current or past Psychiatrist, Therapist, art   Exercise- NO   Fun: Not much fun right now.    Denies abuse and feels safe at home.     Family History  Problem Relation Age of Onset  . Diabetes Mellitus I Mother   . Stroke Mother   . Coronary artery disease Father   . Heart disease Father   . Diabetes Sister   . Kidney disease Sister   . Heart disease Brother   . Pancreatitis Brother   . Kidney disease Brother     No Known Allergies  Outpatient Encounter Medications as of 07/17/2017  Medication Sig  . CALCIUM CARB-ERGOCALCIFEROL PO Take by mouth. Calcium Xrtra: 1000 mg calcium, 400 iu vit D, 4,000 iu Vit A, 100 mg Vit C, 10 mg iron, and 500 mg magnesium. Take 3 by mouth daily with meal  . Cholecalciferol (VITAMIN D) 2000 units CAPS Take 2,000 Units by mouth daily.  Marland Kitchen losartan-hydrochlorothiazide (HYZAAR) 100-25 MG tablet Take 1 tablet by mouth every morning. Needs office visit for more refills.  . milk thistle 175 MG tablet Take 175 mg by mouth 3 (three) times daily.  . Probiotic Product (PROBIOTIC-10) CAPS Take 2 capsules by mouth daily.  Marland Kitchen senna-docusate (SENNA-PLUS) 8.6-50 MG tablet Take 1 tablet by mouth at bedtime as needed for mild constipation.  Marland Kitchen omeprazole (PRILOSEC) 20 MG capsule TAKE 1 CAPSULE (20 MG TOTAL) BY MOUTH DAILY AS NEEDED. ANNUAL APPT IS DUE MUST SEE MD FOR REFILLS (Patient not taking: Reported on 07/17/2017)  . [DISCONTINUED] loratadine (CLARITIN) 10 MG tablet TAKE 1 TABLET BY MOUTH EVERY DAY  . [DISCONTINUED] Multiple Vitamin (MULTIVITAMIN WITH MINERALS) TABS tablet Take 1 tablet by mouth daily.   No facility-administered encounter medications on file as of 07/17/2017.     Review of Systems:  Review of Systems  HENT: Positive for dental problem (bleeding gums).        Dry mouth  Eyes: Positive for  visual disturbance.  Respiratory: Positive for cough.   Gastrointestinal: Positive for constipation.       Flatulence  Musculoskeletal: Positive for arthralgias.       Joint stiffness  Skin: Positive for color change.       Painful areas, itchy  Neurological: Positive for numbness.  Hematological: Bruises/bleeds easily.  Psychiatric/Behavioral: Positive for dysphoric mood and sleep disturbance. The patient is nervous/anxious (with mood swings).   All other systems reviewed and are negative. per NP packet  Health Maintenance  Topic Date Due  . TETANUS/TDAP  02/27/1971  . INFLUENZA VACCINE  01/09/2017  .  DEXA SCAN  02/26/2017  . PNA vac Low Risk Adult (1 of 2 - PCV13) 02/26/2017  . PAP SMEAR  03/15/2018  . MAMMOGRAM  04/24/2018  . COLONOSCOPY  10/28/2023  . Hepatitis C Screening  Completed  . HIV Screening  Completed    Physical Exam: Vitals:   07/17/17 1051  BP: 134/90  Pulse: 74  Resp: 10  Temp: 98.4 F (36.9 C)  TempSrc: Oral  SpO2: 97%  Weight: 173 lb (78.5 kg)  Height: 5' 5.5" (1.664 m)   Body mass index is 28.35 kg/m. Physical Exam  Constitutional: She is oriented to person, place, and time. She appears well-developed and well-nourished.  HENT:  Mouth/Throat: Oropharynx is clear and moist. No oropharyngeal exudate.  No sinus TTP but boggy maxillary tissue texture changes b/l; oropharynx cobblestoning and red but no exudate, (+) post nasal drip; MMM; no oral thrush  Eyes: Pupils are equal, round, and reactive to light. No scleral icterus.  Neck: Neck supple. Carotid bruit is not present. No tracheal deviation present.  Cardiovascular: Normal rate, regular rhythm, normal heart sounds and intact distal pulses. Exam reveals no gallop and no friction rub.  No murmur heard. No LE edema b/l. no calf TTP.   Pulmonary/Chest: Effort normal and breath sounds normal. No stridor. No respiratory distress. She has no wheezes. She has no rales.  Abdominal: Soft. Normal  appearance and bowel sounds are normal. She exhibits no distension and no mass. There is no hepatomegaly. There is tenderness (RUQ; epigastric). There is no rigidity, no rebound and no guarding. No hernia.  obese  Musculoskeletal: She exhibits edema and tenderness.       Lumbar back: She exhibits decreased range of motion, tenderness and spasm.       Back:  Neg SLR  Lymphadenopathy:    She has no cervical adenopathy.  Neurological: She is alert and oriented to person, place, and time.  Skin: Skin is warm and dry. Rash (dry eczematous patchy rash midback with hyperpigmentation and postinflammatory changes) noted.  Psychiatric: She has a normal mood and affect. Her behavior is normal. Judgment and thought content normal.    Labs reviewed: Basic Metabolic Panel: No results for input(s): NA, K, CL, CO2, GLUCOSE, BUN, CREATININE, CALCIUM, MG, PHOS, TSH in the last 8760 hours. Liver Function Tests: No results for input(s): AST, ALT, ALKPHOS, BILITOT, PROT, ALBUMIN in the last 8760 hours. No results for input(s): LIPASE, AMYLASE in the last 8760 hours. No results for input(s): AMMONIA in the last 8760 hours. CBC: No results for input(s): WBC, NEUTROABS, HGB, HCT, MCV, PLT in the last 8760 hours. Lipid Panel: No results for input(s): CHOL, HDL, LDLCALC, TRIG, CHOLHDL, LDLDIRECT in the last 8760 hours. Lab Results  Component Value Date   HGBA1C 6.3 10/14/2015    Procedures since last visit: No results found.  Assessment/Plan   ICD-10-CM   1. Chronic right-sided thoracic back pain M54.6 DG Thoracic Spine 4V   G89.29   2. Chronic low back pain without sciatica, unspecified back pain laterality M54.5 DG Lumbar Spine Complete   G89.29   3. Essential hypertension I10 losartan-hydrochlorothiazide (HYZAAR) 100-25 MG tablet    CMP with eGFR([facility])    CBC with Differential/Platelets  4. Gastroesophageal reflux disease with esophagitis K21.0 omeprazole (PRILOSEC) 20 MG capsule  5. Allergic  rhinitis, unspecified seasonality, unspecified trigger J30.9 montelukast (SINGULAIR) 10 MG tablet    Ambulatory referral to Allergy  6. Prediabetes R73.03 CMP with eGFR([facility])    Hemoglobin A1c  7.  Mixed hyperlipidemia E78.2 TSH    Lipid Panel  8. Atopic dermatitis, unspecified type L20.9    Use moisturizing lotion to dry skin  Recommend OTC hydrocortisone to mid back eczema rash  START SINGULAIR (MONTELUKAST) AT BEDTIME FOR SEASONAL ALLERGY  START FLONASE 1 SPRAY EACH NOSTRIL DAILY FOR ALLERGIC RHINITIS  START CLARITIN DAILY FOR SEASONAL ALLERGY  Continue other medications as ordered  Follow up with Dr Linus Salmons as scheduled  Will call with xray and lab results  Get old records from previous PCP  Follow up in 1 month for welcome to medicare exam   Patricia Aguirre  [facility] [address] [place], [place] [address] [phone] Cell (Monday-Friday 8 AM - 5 PM) [phone] After 5 PM and follow prompts

## 2017-07-17 NOTE — Patient Instructions (Signed)
Use moisturizing lotion to dry skin  Recommend OTC hydrocortisone to mid back eczema rash  START SINGULAIR (MONTELUKAST) AT BEDTIME FOR SEASONAL ALLERGY  START FLONASE 1 SPRAY EACH NOSTRIL DAILY FOR ALLERGIC RHINITIS  START CLARITIN DAILY FOR SEASONAL ALLERGY  Continue other medications as ordered  Follow up with Dr Luciana Axeomer as scheduled  Will call with xray and lab results  Get old records from previous PCP  Follow up in 1 month for welcome to medicare exam

## 2017-07-18 LAB — CBC WITH DIFFERENTIAL/PLATELET
BASOS PCT: 0.5 %
Basophils Absolute: 42 cells/uL (ref 0–200)
EOS ABS: 143 {cells}/uL (ref 15–500)
Eosinophils Relative: 1.7 %
HCT: 38.4 % (ref 35.0–45.0)
HEMOGLOBIN: 13 g/dL (ref 11.7–15.5)
LYMPHS ABS: 3620 {cells}/uL (ref 850–3900)
MCH: 30 pg (ref 27.0–33.0)
MCHC: 33.9 g/dL (ref 32.0–36.0)
MCV: 88.7 fL (ref 80.0–100.0)
MPV: 12 fL (ref 7.5–12.5)
Monocytes Relative: 7.5 %
NEUTROS ABS: 3965 {cells}/uL (ref 1500–7800)
Neutrophils Relative %: 47.2 %
PLATELETS: 219 10*3/uL (ref 140–400)
RBC: 4.33 10*6/uL (ref 3.80–5.10)
RDW: 13 % (ref 11.0–15.0)
TOTAL LYMPHOCYTE: 43.1 %
WBC: 8.4 10*3/uL (ref 3.8–10.8)
WBCMIX: 630 {cells}/uL (ref 200–950)

## 2017-07-18 LAB — COMPLETE METABOLIC PANEL WITH GFR
AG RATIO: 1.3 (calc) (ref 1.0–2.5)
ALBUMIN MSPROF: 4.3 g/dL (ref 3.6–5.1)
ALKALINE PHOSPHATASE (APISO): 69 U/L (ref 33–130)
ALT: 20 U/L (ref 6–29)
AST: 24 U/L (ref 10–35)
BILIRUBIN TOTAL: 0.3 mg/dL (ref 0.2–1.2)
BUN: 12 mg/dL (ref 7–25)
CHLORIDE: 107 mmol/L (ref 98–110)
CO2: 26 mmol/L (ref 20–32)
Calcium: 9.5 mg/dL (ref 8.6–10.4)
Creat: 0.82 mg/dL (ref 0.50–0.99)
GFR, Est African American: 87 mL/min/{1.73_m2} (ref >=60)
GFR, Est Non African American: 75 mL/min/{1.73_m2} (ref >=60)
GLUCOSE: 75 mg/dL (ref 65–139)
Globulin: 3.4 g/dL (calc) (ref 1.9–3.7)
POTASSIUM: 3.6 mmol/L (ref 3.5–5.3)
Sodium: 141 mmol/L (ref 135–146)
Total Protein: 7.7 g/dL (ref 6.1–8.1)

## 2017-07-18 LAB — LIPID PANEL
CHOL/HDL RATIO: 2.4 (calc) (ref <5.0)
Cholesterol: 165 mg/dL (ref <200)
HDL: 68 mg/dL (ref >50)
LDL CHOLESTEROL (CALC): 68 mg/dL
Non-HDL Cholesterol (Calc): 97 mg/dL (calc) (ref <130)
TRIGLYCERIDES: 236 mg/dL — AB (ref <150)

## 2017-07-18 LAB — HEMOGLOBIN A1C
EAG (MMOL/L): 7.1 (calc)
HEMOGLOBIN A1C: 6.1 %{Hb} — AB (ref <5.7)
MEAN PLASMA GLUCOSE: 128 (calc)

## 2017-07-18 LAB — TSH: TSH: 1.37 m[IU]/L (ref 0.40–4.50)

## 2017-07-25 ENCOUNTER — Ambulatory Visit (INDEPENDENT_AMBULATORY_CARE_PROVIDER_SITE_OTHER): Payer: Medicare HMO | Admitting: Internal Medicine

## 2017-07-25 ENCOUNTER — Encounter: Payer: Self-pay | Admitting: Internal Medicine

## 2017-07-25 VITALS — BP 120/78 | HR 91 | Temp 98.0°F | Ht 66.0 in | Wt 179.0 lb

## 2017-07-25 DIAGNOSIS — K7460 Unspecified cirrhosis of liver: Secondary | ICD-10-CM | POA: Diagnosis not present

## 2017-07-25 DIAGNOSIS — B1920 Unspecified viral hepatitis C without hepatic coma: Secondary | ICD-10-CM | POA: Diagnosis not present

## 2017-07-25 DIAGNOSIS — K7290 Hepatic failure, unspecified without coma: Secondary | ICD-10-CM | POA: Diagnosis not present

## 2017-07-25 DIAGNOSIS — K7682 Hepatic encephalopathy: Secondary | ICD-10-CM

## 2017-07-25 NOTE — Assessment & Plan Note (Signed)
Encouraged complete alcohol cessation.   [facility]

## 2017-07-25 NOTE — Assessment & Plan Note (Addendum)
No current encephalopathy.  Not currently requiring lactulose.

## 2017-07-25 NOTE — Assessment & Plan Note (Signed)
Ab will always remain positive, no indication to recheck.  Will not be able to donate blood Minimal detection of HCV RNA.  Most c/w false positive.  Will recheck today.   rtc 1 week.

## 2017-07-25 NOTE — Progress Notes (Signed)
   Subjective:    Patient ID: Patricia Aguirre, female    DOB: [birth year], 67 y.o.   MRN: [id number]  HPI Here for follow up for HCV and cirrhosis. I saw her in 2016 after treatment and cure for hepatitis C when she completed Harvoni and negative at SVR 12.  HCC screening then was negative.  She has not returned for follow up and has been in and out of the area.  She was recently seen by her PCP who repeated a hepatitis C Ab and RNA and the RNA was positive with just 37 copies.  She is here for reevaluation.  No new issues. Has not had recent HCC screening.    Review of Systems  Constitutional: Negative for fatigue.  Gastrointestinal: Negative for diarrhea.  Skin: Negative for rash.       Objective:   Physical Exam  Constitutional: She appears well-developed and well-nourished. No distress.  Eyes: No scleral icterus.  Cardiovascular: Normal rate, regular rhythm and normal heart sounds.  No murmur heard. Pulmonary/Chest: Effort normal and breath sounds normal. No respiratory distress.  Skin: No rash noted.   SH: occasional alcohol       Assessment & Plan:

## 2017-07-26 LAB — COMPLETE METABOLIC PANEL WITH GFR
AG Ratio: 1.2 (calc) (ref 1.0–2.5)
ALBUMIN MSPROF: 4.1 g/dL (ref 3.6–5.1)
ALT: 23 U/L (ref 6–29)
AST: 30 U/L (ref 10–35)
Alkaline phosphatase (APISO): 70 U/L (ref 33–130)
BUN: 13 mg/dL (ref 7–25)
CALCIUM: 9.4 mg/dL (ref 8.6–10.4)
CO2: 26 mmol/L (ref 20–32)
CREATININE: 0.98 mg/dL (ref 0.50–0.99)
Chloride: 105 mmol/L (ref 98–110)
GFR, EST NON AFRICAN AMERICAN: 61 mL/min/{1.73_m2} (ref >=60)
GFR, Est African American: 70 mL/min/{1.73_m2} (ref >=60)
GLUCOSE: 89 mg/dL (ref 65–99)
Globulin: 3.5 g/dL (calc) (ref 1.9–3.7)
Potassium: 3.7 mmol/L (ref 3.5–5.3)
Sodium: 140 mmol/L (ref 135–146)
TOTAL PROTEIN: 7.6 g/dL (ref 6.1–8.1)
Total Bilirubin: 0.3 mg/dL (ref 0.2–1.2)

## 2017-07-27 LAB — HEPATITIS C RNA QUANTITATIVE
HCV QUANT LOG: NOT DETECTED {Log_IU}/mL
HCV RNA, PCR, QN: <15 IU/mL

## 2017-07-31 ENCOUNTER — Ambulatory Visit
Admission: RE | Admit: 2017-07-31 | Discharge: 2017-07-31 | Disposition: A | Discharge status: Home / Self Care | Payer: Medicare HMO | Source: Ambulatory Visit | Attending: Internal Medicine | Admitting: Internal Medicine

## 2017-07-31 DIAGNOSIS — K7460 Unspecified cirrhosis of liver: Secondary | ICD-10-CM | POA: Diagnosis not present

## 2017-08-01 ENCOUNTER — Encounter: Payer: Self-pay | Admitting: Internal Medicine

## 2017-08-01 ENCOUNTER — Ambulatory Visit (INDEPENDENT_AMBULATORY_CARE_PROVIDER_SITE_OTHER): Payer: Medicare HMO | Admitting: Internal Medicine

## 2017-08-01 VITALS — BP 131/84 | HR 87 | Temp 98.4°F | Ht 66.0 in | Wt 175.0 lb

## 2017-08-01 DIAGNOSIS — K7290 Hepatic failure, unspecified without coma: Secondary | ICD-10-CM

## 2017-08-01 DIAGNOSIS — K7460 Unspecified cirrhosis of liver: Secondary | ICD-10-CM | POA: Diagnosis not present

## 2017-08-01 DIAGNOSIS — K7682 Hepatic encephalopathy: Secondary | ICD-10-CM

## 2017-08-01 DIAGNOSIS — B1920 Unspecified viral hepatitis C without hepatic coma: Secondary | ICD-10-CM | POA: Diagnosis not present

## 2017-08-01 NOTE — Progress Notes (Signed)
   Subjective:    Patient ID: Gardiner RamusJean C Engelbrecht, female    DOB: [birth year], 67 y.o.   MRN: [id number]  HPI Here for follow up for HCV and cirrhosis. I saw her in 2016 after treatment and cure for hepatitis C when she completed Harvoni and negative at SVR 12.  HCC screening then was negative.  She has not returned for follow up until recently when she had a repeat hepatitis C Ab and RNA and the RNA was positive with just 37 copies.  Repeat viral RNA is negative CMP wnl. Ultrasound without mass   Review of Systems  Constitutional: Negative for fatigue.  Gastrointestinal: Negative for diarrhea.  Skin: Negative for rash.       Objective:   Physical Exam  Constitutional: She appears well-developed and well-nourished. No distress.  Eyes: No scleral icterus.  Cardiovascular: Normal rate, regular rhythm and normal heart sounds.  No murmur heard. Pulmonary/Chest: Effort normal and breath sounds normal. No respiratory distress.  Skin: No rash noted.   SH: occasional alcohol       Assessment & Plan:

## 2017-08-01 NOTE — Assessment & Plan Note (Signed)
Interesting she has normal platelets, patent portal vein but elevated ammonia level previously, though just 65.  I am not sure if this is true hepatic encephalopathy or not.  She is being referred to GI for EGD, will see if they have an opinion on this.

## 2017-08-01 NOTE — Assessment & Plan Note (Addendum)
HCC screening negative.  Ultrasound every 6 months.  Counseled to avoid alcohol

## 2017-08-01 NOTE — Assessment & Plan Note (Signed)
Confirmed again that hepatitis C is negative, cured.  Recent low level viremia c/w a false positive.  No inidcation to recheck unless new risk factors noted. Hepatitis C Ab will always remain positive, will not be able to donate blood.

## 2017-08-06 ENCOUNTER — Encounter: Payer: Self-pay | Admitting: Gastroenterology

## 2017-08-07 DIAGNOSIS — R69 Illness, unspecified: Secondary | ICD-10-CM | POA: Diagnosis not present

## 2017-08-13 ENCOUNTER — Other Ambulatory Visit: Payer: Self-pay | Admitting: Internal Medicine

## 2017-08-13 DIAGNOSIS — I10 Essential (primary) hypertension: Secondary | ICD-10-CM

## 2017-08-26 DIAGNOSIS — Z0100 Encounter for examination of eyes and vision without abnormal findings: Secondary | ICD-10-CM | POA: Diagnosis not present

## 2017-08-27 ENCOUNTER — Encounter: Payer: Medicare HMO | Admitting: Internal Medicine

## 2017-08-28 ENCOUNTER — Ambulatory Visit: Payer: Medicare HMO | Admitting: Allergy and Immunology

## 2017-09-04 ENCOUNTER — Other Ambulatory Visit: Payer: Medicare HMO

## 2017-09-04 ENCOUNTER — Ambulatory Visit: Payer: Medicare HMO | Admitting: Gastroenterology

## 2017-09-04 ENCOUNTER — Encounter: Payer: Self-pay | Admitting: Gastroenterology

## 2017-09-04 ENCOUNTER — Encounter (INDEPENDENT_AMBULATORY_CARE_PROVIDER_SITE_OTHER): Payer: Self-pay

## 2017-09-04 VITALS — BP 110/80 | HR 72 | Ht 66.0 in | Wt 177.6 lb

## 2017-09-04 DIAGNOSIS — B182 Chronic viral hepatitis C: Secondary | ICD-10-CM

## 2017-09-04 DIAGNOSIS — K7460 Unspecified cirrhosis of liver: Secondary | ICD-10-CM

## 2017-09-04 NOTE — Patient Instructions (Signed)
If you are age 66 or older, your body mass index should be between 23-30. Your Body mass index is 28.67 kg/m. If this is out of the aforementioned range listed, please consider follow up with your Primary Care Provider.  If you are age 66 or younger, your body mass index should be between 19-25. Your Body mass index is 28.67 kg/m. If this is out of the aformentioned range listed, please consider follow up with your Primary Care Provider.   Your physician has requested that you go to the basement for lab work before leaving today.  It has been recommended to you by your physician that you have a(n) Endoscopy completed. Per your request, we did not schedule the procedure(s) today. Please contact our office at [phone] should you decide to have the procedure completed. We have given you a number to assist with your transportation needs.   Thank you for choosing [facility]  Dr Lavon PaganiniNandigam

## 2017-09-04 NOTE — Progress Notes (Signed)
Patricia Aguirre    [id number]    03-Mar-1951  Primary Care Physician:Carter, Maxine Glenn, DO  Referring Physician: Gardiner Barefoot, MD [address] [place], [place] [address]  Chief complaint:  Cirrhosis  HPI: 16 yr F with history chronic Hep C s/p Harvoni with cirrhosis is here to establish care. She was previously followed by Dr Laural Benes at [facility]. 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 SVR at 12 weeks Denies any dysphagia, nausea, vomiting, abdominal pain, melena or bright red blood per rectum Constipation: she has bowel movement every other day, with OTC stool softener but doesn't evacuate completely and sometimes has to starin excessively. Heartburn is better with Omeprazole with only rare breakthrough symptoms  EGD 04/12/2014: No esophageal varices. Duodenal bulb clean based ulcer 4mm, H.pylori negative.  Colonoscopy 10/27/2013: Normal, recall in 10 years  Outpatient Encounter Medications as of 09/04/2017  Medication Sig  . CALCIUM CARB-ERGOCALCIFEROL PO Take by mouth. Calcium Xrtra: 1000 mg calcium, 400 iu vit D, 4,000 iu Vit A, 100 mg Vit C, 10 mg iron, and 500 mg magnesium. Take 3 by mouth daily with meal  . Cholecalciferol (VITAMIN D) 2000 units CAPS Take 2,000 Units by mouth daily.  . fluticasone (FLONASE) 50 MCG/ACT nasal spray Place 1 spray into both nostrils daily. For allergic rhinitis  . loratadine (CLARITIN) 10 MG tablet Take 1 tablet (10 mg total) by mouth daily. For seasonal allergy  . losartan-hydrochlorothiazide (HYZAAR) 100-25 MG tablet TAKE 1 TABLET BY MOUTH DAILY. FOR BLOOD PRESSURE  . milk thistle 175 MG tablet Take 175 mg by mouth 3 (three) times daily.  . montelukast (SINGULAIR) 10 MG tablet Take 1 tablet (10 mg total) by mouth at bedtime. For seasonal allergy  . omeprazole (PRILOSEC) 20 MG capsule Take 1 capsule (20 mg total) by mouth daily as needed. For acid reflux  . Probiotic Product (PROBIOTIC-10) CAPS  Take 2 capsules by mouth daily.  Marland Kitchen senna-docusate (SENNA-PLUS) 8.6-50 MG tablet Take 1 tablet by mouth at bedtime as needed for mild constipation.   No facility-administered encounter medications on file as of 09/04/2017.     Allergies as of 09/04/2017  . (No Known Allergies)    Past Medical History:  Diagnosis Date  . Arthritis   . Coronary artery disease   . H/O hiatal hernia   . Hepatitis    Hep C; dx 1 week ago 10/2113  . Hypertension   . Wears glasses     Past Surgical History:  Procedure Laterality Date  . BREAST LUMPECTOMY WITH RADIOACTIVE SEED LOCALIZATION Right 05/27/2014   Procedure: RIGHT BREAST LUMPECTOMY WITH RADIOACTIVE SEED LOCALIZATION;  Surgeon: Almond Lint, MD;  Location: [facility];  Service: General;  Laterality: Right;  . BREAST SURGERY     breast biopsy-right, atypical cells  . COLONOSCOPY    . COLONOSCOPY WITH PROPOFOL N/A 10/27/2013   Procedure: COLONOSCOPY WITH PROPOFOL;  Surgeon: Charolett Bumpers, MD;  Location: [facility];  Service: Endoscopy;  Laterality: N/A;  . ESOPHAGOGASTRODUODENOSCOPY (EGD) WITH PROPOFOL N/A 04/12/2014   Procedure: ESOPHAGOGASTRODUODENOSCOPY (EGD) WITH PROPOFOL;  Surgeon: Charolett Bumpers, MD;  Location: [facility];  Service: Endoscopy;  Laterality: N/A;  . SEPTOPLASTY  2005  . TUBAL LIGATION      Family History  Problem Relation Age of Onset  . Diabetes Mellitus I Mother   . Stroke Mother   . Coronary artery disease Father   .  Heart disease Father   . Diabetes Sister   . Kidney disease Sister   . Heart disease Brother   . Pancreatitis Brother   . Kidney disease Brother     Social History   Socioeconomic History  . Marital status: Single    Spouse name: Not on file  . Number of children: 1  . Years of education: 4314  . Highest education level: Not on file  Occupational History  . Not on file  Social Needs  . Financial resource strain: Not on file  . Food insecurity:    Worry: Not on file     Inability: Not on file  . Transportation needs:    Medical: Not on file    Non-medical: Not on file  Tobacco Use  . Smoking status: Former Smoker    Packs/day: 0.25    Years: 20.00    Pack years: 5.00    Types: Cigarettes    Last attempt to quit: 05/25/2013    Years since quitting: 4.2  . Smokeless tobacco: Never Used  Substance and Sexual Activity  . Alcohol use: No    Alcohol/week: 0.0 oz    Comment: last -occ   . Drug use: No  . Sexual activity: Never    Comment: stopped-  Lifestyle  . Physical activity:    Days per week: Not on file    Minutes per session: Not on file  . Stress: Not on file  Relationships  . Social connections:    Talks on phone: Not on file    Gets together: Not on file    Attends religious service: Not on file    Active member of club or organization: Not on file    Attends meetings of clubs or organizations: Not on file    Relationship status: Not on file  . Intimate partner violence:    Fear of current or ex partner: Not on file    Emotionally abused: Not on file    Physically abused: Not on file    Forced sexual activity: Not on file  Other Topics Concern  . Not on file  Social History Narrative   Current smoker- 5/day   Alcohol use - Occasionally   Caffeine- coffee/tea   Marital- Single   House-1 story, lives alone. Pets-No   Highest level of education- [employer]   Current or past Programme researcher, broadcasting/film/videoprofession-Medical Transcriber, Clinical biochemistCustomer Service   Exercise- NO   Fun: Not much fun right now.    Denies abuse and feels safe at home.       Review of systems: Review of Systems  Constitutional: Negative for fever and chills. Positive for fatigue HENT: Negative.   Eyes: Negative for blurred vision.  Respiratory: Negative for cough, shortness of breath and wheezing.   Cardiovascular: Negative for chest pain and palpitations.  Gastrointestinal: as per HPI Genitourinary: Negative for dysuria, urgency, frequency and hematuria.  Musculoskeletal:  Positive for myalgias, back pain and joint pain.  Skin: positive for itching and rash on her back intermittently.  Neurological: Negative for dizziness, tremors, focal weakness, seizures and loss of consciousness.  Endo/Heme/Allergies: Positive for seasonal allergies.  Psychiatric/Behavioral: Negative for depression, suicidal ideas and hallucinations.  All other systems reviewed and are negative.   Physical Exam: Vitals:   09/04/17 1413  BP: 110/80  Pulse: 72   Body mass index is 28.67 kg/m. Gen:      No acute distress HEENT:  EOMI, sclera anicteric Neck:     No masses; no thyromegaly  Lungs:    Clear to auscultation bilaterally; normal respiratory effort CV:         Regular rate and rhythm; no murmurs Abd:      + bowel sounds; soft, non-tender; no palpable masses, no distension Ext:    No edema; adequate peripheral perfusion Skin:      Warm and dry Neuro: alert and oriented x 3, No asterixis Psych: normal mood and affect  Data Reviewed:  Reviewed labs, radiology imaging, old records and pertinent past GI work up  Abdomen ultrasound August 08, 2017 Gallbladder:  No gallstones or wall thickening visualized. No sonographic Murphy sign noted by sonographer.  Common bile duct:  Diameter: 2 mm  Liver:  Mildly heterogeneous echotexture diffusely with nodular contour. Rounded soft tissue focus in the central liver at the porta hepatis appears isoechoic to adjacent liver parenchyma and is similar to the prior study, favored to reflect a pseudolesion related to lobularity rather than an actual mass. Portal vein is patent on color Doppler imaging with normal direction of blood flow towards the liver.  IMPRESSION: Cirrhosis without definite hepatic mass identified.  Assessment and Plan/Recommendations:  32 yr F with h/o hepatitis C cirrhosis and ETOH use here to establish care.   Hep C s/p SVR at 12 weeks  Clinically appears compensated, low MELD 6  No  evidence of ascites  No clinical evidence of hepatic encephalopathy, check Ammonia level for baseline  HCC screening: no lesion of liver ultrasound, f/u AFP  Due for screening for esophageal varices: schedule for EGD Patient doesn't want to proceed with EGD at this time.    Due for colorectal cancer screening in May 2025  Alcohol cessation  Greater than 50% of the time used for counseling as well as treatment plan and follow-up. She had multiple questions which were answered to her satisfaction  K. Scherry Ran , MD [phone] Mon-Fri 8a-5p [phone] after 5p, weekends, holidays  CC: Comer, Belia Heman, MD

## 2017-09-05 LAB — ETHANOL: Ethanol: NEGATIVE %

## 2017-09-05 LAB — PROTIME-INR
INR: 1
PROTHROMBIN TIME: 10.9 s (ref 9.0–11.5)

## 2017-09-05 LAB — AFP TUMOR MARKER: AFP TUMOR MARKER: 4.4 ng/mL

## 2017-09-05 LAB — AMMONIA: Ammonia: 67 umol/L (ref <=72)

## 2017-09-09 ENCOUNTER — Other Ambulatory Visit: Payer: Self-pay | Admitting: Internal Medicine

## 2017-09-09 DIAGNOSIS — K2100 Gastro-esophageal reflux disease with esophagitis, without bleeding: Secondary | ICD-10-CM

## 2017-09-17 ENCOUNTER — Ambulatory Visit: Payer: Medicare HMO | Admitting: Allergy and Immunology

## 2017-09-24 ENCOUNTER — Ambulatory Visit (INDEPENDENT_AMBULATORY_CARE_PROVIDER_SITE_OTHER): Payer: Medicare HMO | Admitting: Internal Medicine

## 2017-09-24 ENCOUNTER — Encounter: Payer: Self-pay | Admitting: Internal Medicine

## 2017-09-24 VITALS — BP 128/78 | HR 97 | Temp 98.2°F | Ht 66.0 in | Wt 181.6 lb

## 2017-09-24 DIAGNOSIS — Z0000 Encounter for general adult medical examination without abnormal findings: Secondary | ICD-10-CM | POA: Diagnosis not present

## 2017-09-24 DIAGNOSIS — Z7189 Other specified counseling: Secondary | ICD-10-CM

## 2017-09-24 NOTE — Progress Notes (Signed)
Patient ID: Patricia Aguirre, female   DOB: [birth year], 67 y.o.   MRN: [id number]   Location:  [facility]  Place of Service:  OFFICE  Provider: Elmon Kirschner, DO  Patient Care Team: Kirt Boys, DO as PCP - General (Internal Medicine)  Extended Emergency Contact Information Primary Emergency Contact: [facility] Address: [address]          [place], [place] [address] Darden Amber of [employer] Phone: [phone] Relation: Daughter  Code Status:  Goals of Care: Advanced Directive information Advanced Directives 05/27/2014  Does Patient Have a Medical Advance Directive? No  Would patient like information on creating a medical advance directive? No - patient declined information     Chief Complaint  Patient presents with  . Medicare Wellness    Welcome to Medicare;MMSE 29/30, failed clock drawing. Pt c/o discomfort in back and hacking cough. Basic Eye Exam Due   . Medication Refill    No Refills  . Advance Care Planning    No ACP on file   . Health Maintenance    Per patient DEXA and TDaP up to date     HPI: Patient is a 67 y.o. female seen in today for a Welcome to Medicare Exam.    Depression screen [facility] 2/9 08/01/2017 07/25/2017 07/17/2017 09/21/2014 07/07/2014  Decreased Interest 0 0 0 0 0  Down, Depressed, Hopeless 0 0 0 0 0  PHQ - 2 Score 0 0 0 0 0    Fall Risk  09/24/2017 08/01/2017 07/25/2017 07/17/2017 09/21/2014  Falls in the past year? No No No No No   MMSE - Mini Mental State Exam 09/24/2017  Orientation to time 5  Orientation to Place 5  Registration 3  Attention/ Calculation 4  Recall 3  Language- name 2 objects 2  Language- repeat 1  Language- follow 3 step command 3  Language- read & follow direction 1  Write a sentence 1  Copy design 1  Total score 29     Health Maintenance  Topic Date Due  . TETANUS/TDAP  02/27/1971  . DEXA SCAN  02/26/2017  . PNA vac Low Risk Adult (1 of 2 - PCV13) 02/26/2017  . INFLUENZA VACCINE  01/09/2018  . PAP SMEAR   03/15/2018  . MAMMOGRAM  04/24/2018  . COLONOSCOPY  10/28/2023  . Hepatitis C Screening  Completed  . HIV Screening  Completed    Urinary incontinence? She has stress incontinence with cough, sneeze.  Functional Status Survey: Is the patient deaf or have difficulty hearing?: No Does the patient have difficulty seeing, even when wearing glasses/contacts?: No Does the patient have difficulty concentrating, remembering, or making decisions?: Yes(she spends "most of my time looking for stuff") Does the patient have difficulty walking or climbing stairs?: No Does the patient have difficulty dressing or bathing?: No Does the patient have difficulty doing errands alone such as visiting a doctor's office or shopping?: No  Exercise? No regular but plans to enroll with [employer]  Diet? No healthy food choices; she consumes "a lot of sweets"  No exam data present  Hearing: no hearing loss or tinnitis    Dentition: she gets dental care through [facility] dental school. She is getting dental implants  Pain: she has chronic back pain; constant tingling in left foot x 6 yrs  Past Medical History:  Diagnosis Date  . Arthritis   . Coronary artery disease   . H/O hiatal hernia   . Hepatitis    Hep C; dx 1  week ago 10/2113  . Hypertension   . Wears glasses     Past Surgical History:  Procedure Laterality Date  . BREAST LUMPECTOMY WITH RADIOACTIVE SEED LOCALIZATION Right 05/27/2014   Procedure: RIGHT BREAST LUMPECTOMY WITH RADIOACTIVE SEED LOCALIZATION;  Surgeon: Almond LintFaera Byerly, MD;  Location: [facility];  Service: General;  Laterality: Right;  . BREAST SURGERY     breast biopsy-right, atypical cells  . COLONOSCOPY    . COLONOSCOPY WITH PROPOFOL N/A 10/27/2013   Procedure: COLONOSCOPY WITH PROPOFOL;  Surgeon: Charolett BumpersMartin K Johnson, MD;  Location: [facility];  Service: Endoscopy;  Laterality: N/A;  . ESOPHAGOGASTRODUODENOSCOPY (EGD) WITH PROPOFOL N/A 04/12/2014   Procedure:  ESOPHAGOGASTRODUODENOSCOPY (EGD) WITH PROPOFOL;  Surgeon: Charolett BumpersMartin K Johnson, MD;  Location: [facility];  Service: Endoscopy;  Laterality: N/A;  . SEPTOPLASTY  2005  . TUBAL LIGATION      Family History  Problem Relation Age of Onset  . Diabetes Mellitus I Mother   . Stroke Mother   . Coronary artery disease Father   . Heart disease Father   . Diabetes Sister   . Kidney disease Sister   . Heart disease Brother   . Pancreatitis Brother   . Kidney disease Brother    Family Status  Relation Name Status  . Mother Renea Eevelyn Deceased  . Father Lambert Ketolonzo Deceased  . Sister Illene BolusDorothy Alive  . Brother Chrissie NoaWilliam Deceased  . Brother Onalee HuaDavid Deceased  . Brother Francis DowseJoel Deceased  . Sister Graylon Goodvelyn Alive  . MGM  Deceased  . MGF  Deceased  . PGM  Deceased  . PGF  Deceased  . Daughter Clydie BraunKaren Deceased  . Brother John Deceased  . Brother United States Steel CorporationEdward Alive  . Brother General ElectricWayne Alive  . Brother Borders Groupke Alive  . Brother Molly MaduroRobert Deceased     Social History   Socioeconomic History  . Marital status: Single    Spouse name: Not on file  . Number of children: 1  . Years of education: 8614  . Highest education level: Not on file  Occupational History  . Not on file  Social Needs  . Financial resource strain: Not on file  . Food insecurity:    Worry: Not on file    Inability: Not on file  . Transportation needs:    Medical: Not on file    Non-medical: Not on file  Tobacco Use  . Smoking status: Former Smoker    Packs/day: 0.25    Years: 20.00    Pack years: 5.00    Types: Cigarettes    Last attempt to quit: 05/25/2013    Years since quitting: 4.3  . Smokeless tobacco: Never Used  Substance and Sexual Activity  . Alcohol use: No    Alcohol/week: 0.0 oz    Comment: last -occ   . Drug use: No  . Sexual activity: Never    Comment: stopped-  Lifestyle  . Physical activity:    Days per week: Not on file    Minutes per session: Not on file  . Stress: Not on file  Relationships  . Social connections:     Talks on phone: Not on file    Gets together: Not on file    Attends religious service: Not on file    Active member of club or organization: Not on file    Attends meetings of clubs or organizations: Not on file    Relationship status: Not on file  . Intimate partner violence:    Fear of current or ex  partner: Not on file    Emotionally abused: Not on file    Physically abused: Not on file    Forced sexual activity: Not on file  Other Topics Concern  . Not on file  Social History Narrative   Current smoker- 5/day   Alcohol use - Occasionally   Caffeine- coffee/tea   Marital- Single   House-1 story, lives alone. Pets-No   Highest level of education- [employer]   Current or past Programme researcher, broadcasting/film/video, Clinical biochemist   Exercise- NO   Fun: Not much fun right now.    Denies abuse and feels safe at home.     No Known Allergies  Allergies as of 09/24/2017   No Known Allergies     Medication List        Accurate as of 09/24/17 11:37 AM. Always use your most recent med list.          CALCIUM CARB-ERGOCALCIFEROL PO Take by mouth. Calcium Xrtra: 1000 mg calcium, 400 iu vit D, 4,000 iu Vit A, 100 mg Vit C, 10 mg iron, and 500 mg magnesium. Take 3 by mouth daily with meal   fluticasone 50 MCG/ACT nasal spray Commonly known as:  FLONASE Place 1 spray into both nostrils daily. For allergic rhinitis   loratadine 10 MG tablet Commonly known as:  CLARITIN Take 1 tablet (10 mg total) by mouth daily. For seasonal allergy   losartan-hydrochlorothiazide 100-25 MG tablet Commonly known as:  HYZAAR TAKE 1 TABLET BY MOUTH DAILY. FOR BLOOD PRESSURE   milk thistle 175 MG tablet Take 175 mg by mouth 3 (three) times daily.   montelukast 10 MG tablet Commonly known as:  SINGULAIR Take 1 tablet (10 mg total) by mouth at bedtime. For seasonal allergy   omeprazole 20 MG capsule Commonly known as:  PRILOSEC TAKE 1 CAPSULE (20 MG TOTAL) BY MOUTH DAILY AS NEEDED. FOR ACID  REFLUX   PROBIOTIC-10 Caps Take 2 capsules by mouth daily.   SENNA-PLUS 8.6-50 MG tablet Generic drug:  senna-docusate Take 1 tablet by mouth at bedtime as needed for mild constipation.   Vitamin D 2000 units Caps Take 2,000 Units by mouth daily.        Review of Systems:  Review of Systems  Physical Exam: Vitals:   09/24/17 1111  BP: 128/78  Pulse: 97  Temp: 98.2 F (36.8 C)  TempSrc: Oral  SpO2: 98%  Weight: 181 lb 9.6 oz (82.4 kg)  Height: 5\' 6"  (1.676 m)   Body mass index is 29.31 kg/m. Physical Exam  Labs reviewed:  Basic Metabolic Panel: Recent Labs    07/17/17 1225 07/25/17 1530  NA 141 140  K 3.6 3.7  CL 107 105  CO2 26 26  GLUCOSE 75 89  BUN 12 13  CREATININE 0.82 0.98  CALCIUM 9.5 9.4  TSH 1.37  --    Liver Function Tests: Recent Labs    07/17/17 1225 07/25/17 1530  AST 24 30  ALT 20 23  BILITOT 0.3 0.3  PROT 7.7 7.6   No results for input(s): LIPASE, AMYLASE in the last 8760 hours. Recent Labs    09/04/17 1459  AMMONIA 67   CBC: Recent Labs    07/17/17 1225  WBC 8.4  NEUTROABS 3,965  HGB 13.0  HCT 38.4  MCV 88.7  PLT 219   Lipid Panel: Recent Labs    07/17/17 1225  CHOL 165  HDL 68  LDLCALC 68  TRIG 236*  CHOLHDL 2.4   Lab  Results  Component Value Date   HGBA1C 6.1 (H) 07/17/2017    Procedures: No results found.  Assessment/Plan   ICD-10-CM   1. Medicare welcome exam Z00.00   2. Advance directive discussed with patient Z71.89    SPENT 20 MINUTES DISCUSSING ADVANCED DIRECTIVES - living will, HCPOA; code status and desires for CPR, intubation, FT, IVF, abx. Handout provided. She will think about it and get back to [place].  Recommend you get xrays of back as ordered  Continue current medications as ordered  Exercise as tolerated  Eat well balanced meals  Return to office to further discuss cough  Follow up in 4 mos for HTN, arthritis.   Keeping You Healthy handout given     Bertis Ruddy. Ancil Linsey  [facility] and Adult Medicine [address] [place], [place] [address] [phone] Cell (Monday-Friday 8 AM - 5 PM) [phone] After 5 PM and follow prompts

## 2017-09-24 NOTE — Patient Instructions (Addendum)
Recommend you get xrays of back as ordered  Continue current medications as ordered  Exercise as tolerated  Eat well balanced meals  Return to office to further discuss cough  Follow up in 4 mos for HTN, arthritis.   Keeping You Healthy  Get These Tests  Blood Pressure- Have your blood pressure checked by your healthcare provider at least once a year.  Normal blood pressure is 120/80.  Weight- Have your body mass index (BMI) calculated to screen for obesity.  BMI is a measure of body fat based on height and weight.  You can calculate your own BMI at https://www.west-esparza.com/  Cholesterol- Have your cholesterol checked every year.  Diabetes- Have your blood sugar checked every year if you have high blood pressure, high cholesterol, a family history of diabetes or if you are overweight.  Pap Test - Have a pap test every 1 to 5 years if you have been sexually active.  If you are older than 65 and recent pap tests have been normal you may not need additional pap tests.  In addition, if you have had a hysterectomy  for benign disease additional pap tests are not necessary.  Mammogram-Yearly mammograms are essential for early detection of breast cancer  Screening for Colon Cancer- Colonoscopy starting at age 36. Screening may begin sooner depending on your family history and other health conditions.  Follow up colonoscopy as directed by your Gastroenterologist.  Screening for Osteoporosis- Screening begins at age 11 with bone density scanning, sooner if you are at higher risk for developing Osteoporosis.  Get these medicines  Calcium with Vitamin D- Your body requires 1200-1500 mg of Calcium a day and [phone] IU of Vitamin D a day.  You can only absorb 500 mg of Calcium at a time therefore Calcium must be taken in 2 or 3 separate doses throughout the day.  Hormones- Hormone therapy has been associated with increased risk for certain cancers and heart disease.  Talk to your healthcare  provider about if you need relief from menopausal symptoms.  Aspirin- Ask your healthcare provider about taking Aspirin to prevent Heart Disease and Stroke.  Get these Immuniztions  Flu shot- Every fall  Pneumonia shot- Once after the age of 20; if you are younger ask your healthcare provider if you need a pneumonia shot.  Tetanus- Every ten years.  Zostavax- Once after the age of 41 to prevent shingles.  Take these steps  Don't smoke- Your healthcare provider can help you quit. For tips on how to quit, ask your healthcare provider or go to www.smokefree.gov or call 1-800 QUIT-NOW.  Be physically active- Exercise 5 days a week for a minimum of 30 minutes.  If you are not already physically active, start slow and gradually work up to 30 minutes of moderate physical activity.  Try walking, dancing, bike riding, swimming, etc.  Eat a healthy diet- Eat a variety of healthy foods such as fruits, vegetables, whole grains, low fat milk, low fat cheeses, yogurt, lean meats, chicken, fish, eggs, dried beans, tofu, etc.  For more information go to www.thenutritionsource.org  Dental visit- Brush and floss teeth twice daily; visit your dentist twice a year.  Eye exam- Visit your Optometrist or Ophthalmologist yearly.  Drink alcohol in moderation- Limit alcohol intake to one drink or less a day.  Never drink and drive.  Depression- Your emotional health is as important as your physical health.  If you're feeling down or losing interest in things you normally enjoy, please talk  to your healthcare provider.  Seat Belts- can save your life; always wear one  Smoke/Carbon Monoxide detectors- These detectors need to be installed on the appropriate level of your home.  Replace batteries at least once a year.  Violence- If anyone is threatening or hurting you, please tell your healthcare provider.  Living Will/ Health care power of attorney- Discuss with your healthcare provider and family.

## 2017-09-25 ENCOUNTER — Encounter: Payer: Self-pay | Admitting: Internal Medicine

## 2017-10-16 ENCOUNTER — Ambulatory Visit
Admission: RE | Admit: 2017-10-16 | Discharge: 2017-10-16 | Disposition: A | Discharge status: Home / Self Care | Payer: Medicare HMO | Source: Ambulatory Visit | Attending: Internal Medicine | Admitting: Internal Medicine

## 2017-10-16 DIAGNOSIS — G8929 Other chronic pain: Secondary | ICD-10-CM

## 2017-10-16 DIAGNOSIS — M545 Low back pain, unspecified: Secondary | ICD-10-CM

## 2017-10-16 DIAGNOSIS — M48061 Spinal stenosis, lumbar region without neurogenic claudication: Secondary | ICD-10-CM | POA: Diagnosis not present

## 2017-10-23 ENCOUNTER — Other Ambulatory Visit: Payer: Self-pay | Admitting: Internal Medicine

## 2017-10-23 ENCOUNTER — Ambulatory Visit
Admission: RE | Admit: 2017-10-23 | Discharge: 2017-10-23 | Disposition: A | Discharge status: Home / Self Care | Payer: Medicare HMO | Source: Ambulatory Visit | Attending: Internal Medicine | Admitting: Internal Medicine

## 2017-10-23 DIAGNOSIS — G8929 Other chronic pain: Secondary | ICD-10-CM

## 2017-10-23 DIAGNOSIS — M546 Pain in thoracic spine: Secondary | ICD-10-CM | POA: Diagnosis not present

## 2017-10-25 ENCOUNTER — Encounter: Payer: Self-pay | Admitting: Internal Medicine

## 2017-11-12 DIAGNOSIS — R69 Illness, unspecified: Secondary | ICD-10-CM | POA: Diagnosis not present

## 2017-12-16 ENCOUNTER — Other Ambulatory Visit: Payer: Self-pay | Admitting: Internal Medicine

## 2017-12-16 DIAGNOSIS — I10 Essential (primary) hypertension: Secondary | ICD-10-CM

## 2017-12-17 ENCOUNTER — Other Ambulatory Visit: Payer: Self-pay | Admitting: Internal Medicine

## 2017-12-26 DIAGNOSIS — R69 Illness, unspecified: Secondary | ICD-10-CM | POA: Diagnosis not present

## 2018-01-01 ENCOUNTER — Ambulatory Visit: Payer: Medicare HMO | Admitting: Internal Medicine

## 2018-01-07 ENCOUNTER — Encounter: Payer: Self-pay | Admitting: Internal Medicine

## 2018-01-07 ENCOUNTER — Ambulatory Visit (INDEPENDENT_AMBULATORY_CARE_PROVIDER_SITE_OTHER): Payer: Medicare HMO | Admitting: Internal Medicine

## 2018-01-07 VITALS — BP 108/70 | HR 75 | Temp 98.0°F | Resp 20 | Ht 66.0 in | Wt 177.4 lb

## 2018-01-07 DIAGNOSIS — R7303 Prediabetes: Secondary | ICD-10-CM | POA: Diagnosis not present

## 2018-01-07 DIAGNOSIS — E782 Mixed hyperlipidemia: Secondary | ICD-10-CM

## 2018-01-07 DIAGNOSIS — J309 Allergic rhinitis, unspecified: Secondary | ICD-10-CM | POA: Diagnosis not present

## 2018-01-07 DIAGNOSIS — K2100 Gastro-esophageal reflux disease with esophagitis, without bleeding: Secondary | ICD-10-CM

## 2018-01-07 DIAGNOSIS — I10 Essential (primary) hypertension: Secondary | ICD-10-CM | POA: Diagnosis not present

## 2018-01-07 DIAGNOSIS — Z720 Tobacco use: Secondary | ICD-10-CM | POA: Diagnosis not present

## 2018-01-07 DIAGNOSIS — K7460 Unspecified cirrhosis of liver: Secondary | ICD-10-CM | POA: Diagnosis not present

## 2018-01-07 DIAGNOSIS — R0789 Other chest pain: Secondary | ICD-10-CM

## 2018-01-07 NOTE — Progress Notes (Signed)
Patient ID: Patricia Aguirre, female   DOB: 05/18/1951, 67 y.o.   MRN: [id number]   Location:  [facility] OFFICE  Provider: DR Arletha Grippe  Code Status:  Goals of Care:  Advanced Directives 05/27/2014  Does Patient Have a Medical Advance Directive? No  Would patient like information on creating a medical advance directive? No - patient declined information     Chief Complaint  Patient presents with  . Medical Management of Chronic Issues    4 mo- HTN, arthitis    HPI: Patient is a 67 y.o. female seen today for medical management of chronic diseases.  She has sinus congestion and takes OTC mucinex. She believes mucinex d works better but knows it can affect her blood pressure. She smokes cigs "occasionally" but previously smoked 1/4 PPD. She has a intermittent cough.  HTN - stable on losartan hct  GERD/Hiatal hernia - sx's controlled on omeprazole. She saw GI and declined EGD for esophageal varices. She denies any heavy Etoh use but does drink a "g;ass of red wine every now and then"  Hep C with cirrhosis - followed by ID Dr Linus Salmons. She was tx with Harvoni and completed tx in the past. F/u testing neg. She saw ID Dr Linus Salmons and was told she had false (+) lab. She was referred to GI for EGD but declined study to eval esophageal varices.  Allergic rhinitis - has had septoplasty in the past. She has recurrent sinus congestion. She has tried multiple Rx oral antihistamines and nasal steroids in the past without relief. Has seen ENT in the past. She misses multiple doses of singulair each week.  Fibrocystic breast disease - she underwent right breast lumpectomy in 05/2014 by Dr Barry Dienes for atypical ductal hyperplasia. Path report revealed fibrocystic changes and no maligancy  Past Medical History:  Diagnosis Date  . Arthritis   . Coronary artery disease   . H/O hiatal hernia   . Hepatitis    Hep C; dx 1 week ago 10/2113  . Hypertension   . Wears glasses     Past Surgical History:  Procedure  Laterality Date  . BREAST LUMPECTOMY WITH RADIOACTIVE SEED LOCALIZATION Right 05/27/2014   Procedure: RIGHT BREAST LUMPECTOMY WITH RADIOACTIVE SEED LOCALIZATION;  Surgeon: Stark Klein, MD;  Location: [place];  Service: General;  Laterality: Right;  . BREAST SURGERY     breast biopsy-right, atypical cells  . COLONOSCOPY    . COLONOSCOPY WITH PROPOFOL N/A 10/27/2013   Procedure: COLONOSCOPY WITH PROPOFOL;  Surgeon: Garlan Fair, MD;  [facility]: [facility];  Service: Endoscopy;  Laterality: N/A;  . ESOPHAGOGASTRODUODENOSCOPY (EGD) WITH PROPOFOL N/A 04/12/2014   Procedure: ESOPHAGOGASTRODUODENOSCOPY (EGD) WITH PROPOFOL;  Surgeon: Garlan Fair, MD;  Location: [facility];  Service: Endoscopy;  Laterality: N/A;  . SEPTOPLASTY  2005  . TUBAL LIGATION       reports that she quit smoking about 4 years ago. Her smoking use included cigarettes. She has a 5.00 pack-year smoking history. She has never used smokeless tobacco. She reports that she does not drink alcohol or use drugs. Social History   Socioeconomic History  . Marital status: Single    Spouse name: Not on file  . Number of children: 1  . Years of education: 56  . Highest education level: Not on file  Occupational History  . Not on file  Social Needs  . Financial resource strain: Not on file  . Food insecurity:    Worry: Not on file  Inability: Not on file  . Transportation needs:    Medical: Not on file    Non-medical: Not on file  Tobacco Use  . Smoking status: Former Smoker    Packs/day: 0.25    Years: 20.00    Pack years: 5.00    Types: Cigarettes    Last attempt to quit: 05/25/2013    Years since quitting: 4.6  . Smokeless tobacco: Never Used  Substance and Sexual Activity  . Alcohol use: No    Alcohol/week: 0.0 oz    Comment: last -occ   . Drug use: No  . Sexual activity: Never    Comment: stopped-  Lifestyle  . Physical activity:    Days per week: Not on file    Minutes per  session: Not on file  . Stress: Not on file  Relationships  . Social connections:    Talks on phone: Not on file    Gets together: Not on file    Attends religious service: Not on file    Active member of club or organization: Not on file    Attends meetings of clubs or organizations: Not on file    Relationship status: Not on file  . Intimate partner violence:    Fear of current or ex partner: Not on file    Emotionally abused: Not on file    Physically abused: Not on file    Forced sexual activity: Not on file  Other Topics Concern  . Not on file  Social History Narrative   Current smoker- 5/day   Alcohol use - Occasionally   Caffeine- coffee/tea   Marital- Single   House-1 story, lives alone. Pets-No   Highest level of education- [employer]   Current or past Psychiatrist, Therapist, art   Exercise- NO   Fun: Not much fun right now.    Denies abuse and feels safe at home.     Family History  Problem Relation Age of Onset  . Diabetes Mellitus I Mother   . Stroke Mother   . Coronary artery disease Father   . Heart disease Father   . Diabetes Sister   . Kidney disease Sister   . Heart disease Brother   . Pancreatitis Brother   . Kidney disease Brother     No Known Allergies  Outpatient Encounter Medications as of 01/07/2018  Medication Sig  . CALCIUM CARB-ERGOCALCIFEROL PO Take by mouth. Calcium Xrtra: 1000 mg calcium, 400 iu vit D, 4,000 iu Vit A, 100 mg Vit C, 10 mg iron, and 500 mg magnesium. Take 3 by mouth daily with meal  . Cholecalciferol (VITAMIN D) 2000 units CAPS Take 2,000 Units by mouth daily.  . fluticasone (FLONASE) 50 MCG/ACT nasal spray PLACE 1 SPRAY INTO BOTH NOSTRILS DAILY. FOR ALLERGIC RHINITIS  . loratadine (CLARITIN) 10 MG tablet Take 1 tablet (10 mg total) by mouth daily. For seasonal allergy  . losartan-hydrochlorothiazide (HYZAAR) 100-25 MG tablet TAKE 1 TABLET BY MOUTH DAILY. FOR BLOOD PRESSURE  . milk thistle 175  MG tablet Take 175 mg by mouth 3 (three) times daily.  . montelukast (SINGULAIR) 10 MG tablet Take 1 tablet (10 mg total) by mouth at bedtime. For seasonal allergy  . omeprazole (PRILOSEC) 20 MG capsule TAKE 1 CAPSULE (20 MG TOTAL) BY MOUTH DAILY AS NEEDED. FOR ACID REFLUX  . Probiotic Product (PROBIOTIC-10) CAPS Take 2 capsules by mouth daily.  Marland Kitchen senna-docusate (SENNA-PLUS) 8.6-50 MG tablet Take 1 tablet by mouth at bedtime as needed for  mild constipation.   No facility-administered encounter medications on file as of 01/07/2018.     Review of Systems:  Review of Systems  HENT: Positive for sinus pressure.   All other systems reviewed and are negative.   Health Maintenance  Topic Date Due  . TETANUS/TDAP  02/27/1971  . DEXA SCAN  02/26/2017  . PNA vac Low Risk Adult (1 of 2 - PCV13) 02/26/2017  . INFLUENZA VACCINE  01/09/2018  . PAP SMEAR  03/15/2018  . MAMMOGRAM  04/24/2018  . COLONOSCOPY  10/28/2023  . Hepatitis C Screening  Completed  . HIV Screening  Completed    Physical Exam: Vitals:   01/07/18 1439  BP: 108/70  Pulse: 75  Resp: 20  Temp: 98 F (36.7 C)  TempSrc: Oral  SpO2: 96%  Weight: 177 lb 6.4 oz (80.5 kg)  Height: _0  (1.676 m)   Body mass index is 28.63 kg/m. Physical Exam  Constitutional: She is oriented to person, place, and time. She appears well-developed and well-nourished.  HENT:  Mouth/Throat: Oropharynx is clear and moist. No oropharyngeal exudate.  MMM; no oral thrush  Eyes: Pupils are equal, round, and reactive to light. No scleral icterus.  Neck: Neck supple. Carotid bruit is not present. No tracheal deviation present. No thyromegaly present.  Cardiovascular: Normal rate, regular rhythm, normal heart sounds and intact distal pulses. Exam reveals no gallop and no friction rub.  No murmur heard. No LE edema b/l. no calf TTP.   Pulmonary/Chest: Effort normal. No stridor. No respiratory distress. She has no wheezes. She has rales (right  basilar).  Prolonged expiratory phase  Abdominal: Soft. Normal appearance and bowel sounds are normal. She exhibits no distension and no mass. There is hepatomegaly. There is no tenderness. There is no rigidity, no rebound and no guarding. No hernia.  Musculoskeletal: She exhibits edema.  Lymphadenopathy:    She has no cervical adenopathy.  Neurological: She is alert and oriented to person, place, and time. She has normal reflexes.  Skin: Skin is warm and dry. No rash noted.  Psychiatric: She has a normal mood and affect. Her behavior is normal. Judgment and thought content normal.    Labs reviewed: Basic Metabolic Panel: Recent Labs    07/17/17 1225 07/25/17 1530  NA 141 140  K 3.6 3.7  CL 107 105  CO2 26 26  GLUCOSE 75 89  BUN 12 13  CREATININE 0.82 0.98  CALCIUM 9.5 9.4  TSH 1.37  --    Liver Function Tests: Recent Labs    07/17/17 1225 07/25/17 1530  AST 24 30  ALT 20 23  BILITOT 0.3 0.3  PROT 7.7 7.6   No results for input(s): LIPASE, AMYLASE in the last 8760 hours. Recent Labs    09/04/17 1459  AMMONIA 67   CBC: Recent Labs    07/17/17 1225  WBC 8.4  NEUTROABS 3,965  HGB 13.0  HCT 38.4  MCV 88.7  PLT 219   Lipid Panel: Recent Labs    07/17/17 1225  CHOL 165  HDL 68  LDLCALC 68  TRIG 236*  CHOLHDL 2.4   Lab Results  Component Value Date   HGBA1C 6.1 (H) 07/17/2017    Procedures since last visit: No results found.  Assessment/Plan   ICD-10-CM   1. Other chest pain R07.89 CT Chest Wo Contrast  2. Essential hypertension I10   3. Allergic rhinitis, unspecified seasonality, unspecified trigger J30.9   4. Gastroesophageal reflux disease with esophagitis K21.0  5. Mixed hyperlipidemia E78.2 Lipid Panel  6. Prediabetes R73.03 Hemoglobin A1c  7. Tobacco abuse Z72.0 CT Chest Wo Contrast  8. Cirrhosis of liver without ascites, unspecified hepatic cirrhosis type ([place]) K74.60 CMP with eGFR([facility])    Will call with CT chest appt  Continue  current medications as ordered  Will call with lab results  Complete smoking cessation discussed and highly urged  NO ALCOHOL USE  Follow up in 6 mos with Patricia Aguirre for cirrhosis, allergic rhinitis, HTN.  Radames Mejorado S. Perlie Gold  [facility] and Adult Medicine [address] [place], [place] [address] [phone] Cell (Monday-Friday 8 AM - 5 PM) [phone] After 5 PM and follow prompts

## 2018-01-07 NOTE — Patient Instructions (Addendum)
Will call with CT chest appt  Continue current medications as ordered  Will call with lab results  Complete smoking cessation discussed and highly urged  NO ALCOHOL USE  Follow up in 6 mos with Shanda BumpsJessica for cirrhosis, allergic rhinitis, HTN.

## 2018-01-08 LAB — LIPID PANEL
CHOLESTEROL: 202 mg/dL — AB (ref <200)
HDL: 60 mg/dL (ref >50)
LDL Cholesterol (Calc): 114 mg/dL (calc) — ABNORMAL HIGH
Non-HDL Cholesterol (Calc): 142 mg/dL (calc) — ABNORMAL HIGH (ref <130)
Total CHOL/HDL Ratio: 3.4 (calc) (ref <5.0)
Triglycerides: 164 mg/dL — ABNORMAL HIGH (ref <150)

## 2018-01-08 LAB — COMPLETE METABOLIC PANEL WITH GFR
AG RATIO: 1.2 (calc) (ref 1.0–2.5)
ALT: 19 U/L (ref 6–29)
AST: 25 U/L (ref 10–35)
Albumin: 4.6 g/dL (ref 3.6–5.1)
Alkaline phosphatase (APISO): 72 U/L (ref 33–130)
BUN: 20 mg/dL (ref 7–25)
CALCIUM: 10.2 mg/dL (ref 8.6–10.4)
CO2: 25 mmol/L (ref 20–32)
Chloride: 104 mmol/L (ref 98–110)
Creat: 0.99 mg/dL (ref 0.50–0.99)
GFR, EST AFRICAN AMERICAN: 69 mL/min/{1.73_m2} (ref >=60)
GFR, EST NON AFRICAN AMERICAN: 60 mL/min/{1.73_m2} (ref >=60)
GLOBULIN: 3.7 g/dL (ref 1.9–3.7)
Glucose, Bld: 83 mg/dL (ref 65–139)
POTASSIUM: 3.8 mmol/L (ref 3.5–5.3)
SODIUM: 139 mmol/L (ref 135–146)
TOTAL PROTEIN: 8.3 g/dL — AB (ref 6.1–8.1)
Total Bilirubin: 0.6 mg/dL (ref 0.2–1.2)

## 2018-01-08 LAB — HEMOGLOBIN A1C
Hgb A1c MFr Bld: 6.3 % of total Hgb — ABNORMAL HIGH (ref <5.7)
MEAN PLASMA GLUCOSE: 134 (calc)
eAG (mmol/L): 7.4 (calc)

## 2018-01-10 ENCOUNTER — Encounter: Payer: Self-pay | Admitting: Internal Medicine

## 2018-01-10 DIAGNOSIS — Z720 Tobacco use: Secondary | ICD-10-CM | POA: Insufficient documentation

## 2018-01-10 DIAGNOSIS — R7303 Prediabetes: Secondary | ICD-10-CM | POA: Insufficient documentation

## 2018-01-10 DIAGNOSIS — E782 Mixed hyperlipidemia: Secondary | ICD-10-CM | POA: Insufficient documentation

## 2018-01-14 ENCOUNTER — Other Ambulatory Visit: Payer: Self-pay

## 2018-01-14 DIAGNOSIS — Z720 Tobacco use: Secondary | ICD-10-CM

## 2018-01-14 DIAGNOSIS — R0789 Other chest pain: Secondary | ICD-10-CM

## 2018-01-20 ENCOUNTER — Telehealth: Payer: Self-pay

## 2018-01-20 NOTE — Telephone Encounter (Signed)
Left message on voicemail for patient to return call when available   

## 2018-01-20 NOTE — Telephone Encounter (Signed)
Patient called right back and states she is aware that she needs to get chest xray yet she is having car trouble. Patient plans to get xray within the next 2-3 days

## 2018-01-20 NOTE — Telephone Encounter (Signed)
Has pt had CXR yet?

## 2018-01-20 NOTE — Telephone Encounter (Signed)
Incoming fax received from [facility] informing ordering physician: Dr.Carter, CT Chest w/o contrast denied.   Based on medicare local coverage determination and [employer] chest imaging guidelines, cannot approve this request. A recent chest x-ray is supported within the last 60 for the clinical indications presented. The clinical information provided does not describe these results and therefore, the request is not indicated at this time  Ref Number [id number]  You have the right to an appeal, full report placed in review and sign folder    It appears an xray order was placed on 01/14/18

## 2018-01-24 ENCOUNTER — Ambulatory Visit
Admission: RE | Admit: 2018-01-24 | Discharge: 2018-01-24 | Disposition: A | Discharge status: Home / Self Care | Payer: Medicare HMO | Source: Ambulatory Visit | Attending: Internal Medicine | Admitting: Internal Medicine

## 2018-01-24 ENCOUNTER — Other Ambulatory Visit: Payer: Self-pay | Admitting: Internal Medicine

## 2018-01-24 DIAGNOSIS — K2100 Gastro-esophageal reflux disease with esophagitis, without bleeding: Secondary | ICD-10-CM

## 2018-01-24 DIAGNOSIS — Z720 Tobacco use: Secondary | ICD-10-CM

## 2018-01-24 DIAGNOSIS — R0789 Other chest pain: Secondary | ICD-10-CM

## 2018-01-24 DIAGNOSIS — R918 Other nonspecific abnormal finding of lung field: Secondary | ICD-10-CM | POA: Diagnosis not present

## 2018-01-27 ENCOUNTER — Other Ambulatory Visit: Payer: Self-pay

## 2018-01-27 DIAGNOSIS — R0789 Other chest pain: Secondary | ICD-10-CM

## 2018-01-27 DIAGNOSIS — R059 Cough, unspecified: Secondary | ICD-10-CM

## 2018-01-27 DIAGNOSIS — R05 Cough: Secondary | ICD-10-CM

## 2018-01-29 ENCOUNTER — Encounter: Payer: Self-pay | Admitting: Internal Medicine

## 2018-03-07 ENCOUNTER — Telehealth: Payer: Self-pay

## 2018-03-07 NOTE — Telephone Encounter (Signed)
Patient called to question lab results from July for she had not heard about them yet she received a bill.   I discussed results with patient, patient verbalized understanding of results. I was unable to advise on why she had not heard about results prior to calling.   Patient also requested a copy of labs, mailed as requested

## 2018-03-26 DIAGNOSIS — R69 Illness, unspecified: Secondary | ICD-10-CM | POA: Diagnosis not present

## 2018-04-11 ENCOUNTER — Other Ambulatory Visit: Payer: Self-pay | Admitting: Internal Medicine

## 2018-04-29 ENCOUNTER — Other Ambulatory Visit: Payer: Self-pay

## 2018-04-29 DIAGNOSIS — I10 Essential (primary) hypertension: Secondary | ICD-10-CM

## 2018-04-29 MED ORDER — LOSARTAN POTASSIUM-HCTZ 100-25 MG PO TABS: 1.0000 | ORAL_TABLET | Freq: Every day | ORAL | 2 refills | Status: DC

## 2018-05-13 DIAGNOSIS — Z0100 Encounter for examination of eyes and vision without abnormal findings: Secondary | ICD-10-CM | POA: Diagnosis not present

## 2018-05-13 DIAGNOSIS — H259 Unspecified age-related cataract: Secondary | ICD-10-CM | POA: Diagnosis not present

## 2018-05-16 ENCOUNTER — Telehealth: Payer: Self-pay

## 2018-05-16 NOTE — Telephone Encounter (Signed)
Received fax from [facility] that losartan-hydrochlorothiazide 100-25mg  is on back order. Pharmacy is requesting to separate the two. Please advise on medication order.

## 2018-05-23 MED ORDER — HYDROCHLOROTHIAZIDE 25 MG PO TABS: 25.0000 mg | ORAL_TABLET | Freq: Every day | ORAL | 3 refills | Status: DC

## 2018-05-23 MED ORDER — LOSARTAN POTASSIUM 100 MG PO TABS: 100.0000 mg | ORAL_TABLET | Freq: Every day | ORAL | 3 refills | Status: DC

## 2018-05-23 NOTE — Telephone Encounter (Signed)
Medication sent to pharmacy  

## 2018-06-17 DIAGNOSIS — R69 Illness, unspecified: Secondary | ICD-10-CM | POA: Diagnosis not present

## 2018-06-24 ENCOUNTER — Encounter: Payer: Self-pay | Admitting: Family

## 2018-06-24 ENCOUNTER — Ambulatory Visit
Admission: RE | Admit: 2018-06-24 | Discharge: 2018-06-24 | Disposition: A | Discharge status: Home / Self Care | Payer: Medicare HMO | Source: Ambulatory Visit | Attending: Family | Admitting: Family

## 2018-06-24 ENCOUNTER — Ambulatory Visit (INDEPENDENT_AMBULATORY_CARE_PROVIDER_SITE_OTHER): Payer: Medicare HMO | Admitting: Family

## 2018-06-24 VITALS — BP 110/80 | HR 52 | Temp 98.0°F | Ht 66.0 in | Wt 179.4 lb

## 2018-06-24 DIAGNOSIS — M79622 Pain in left upper arm: Secondary | ICD-10-CM

## 2018-06-24 DIAGNOSIS — M5412 Radiculopathy, cervical region: Secondary | ICD-10-CM

## 2018-06-24 DIAGNOSIS — M542 Cervicalgia: Secondary | ICD-10-CM | POA: Diagnosis not present

## 2018-06-24 MED ORDER — KETOROLAC TROMETHAMINE 30 MG/ML IJ SOLN: 30.0000 mg | Freq: Once | INTRAMUSCULAR | 0 refills | Status: AC

## 2018-06-24 MED ORDER — KETOROLAC TROMETHAMINE 30 MG/ML IJ SOLN
30.0000 mg | Freq: Once | INTRAMUSCULAR | Status: AC
  Administered 2018-06-24: 30 mg via INTRAMUSCULAR

## 2018-06-24 MED ORDER — ACETAMINOPHEN 500 MG PO TABS: 500.0000 mg | ORAL_TABLET | Freq: Three times a day (TID) | ORAL | 0 refills | Status: AC | PRN

## 2018-06-24 NOTE — Patient Instructions (Signed)
1. Take Extra strength tylenol 500 mg tablet one by mouth every 8 hours as needed for pain   2. Please get cervical spine X-ray done at [facility] imaging.Will call you with results.   3. Notify provider's office or go to ER  if neck/left arm pain worsen or any numbness and tingling.

## 2018-06-24 NOTE — Progress Notes (Signed)
Provider: Dinah Ngetich FNP-C  Sharon Seller, NP  Patient Care Team: Sharon Seller, NP as PCP - General (Geriatric Medicine)  Extended Emergency Contact Information Primary Emergency Contact: [facility] Address: [address]          [place], [place] [address] Darden Amber of [place] Phone: [phone] Relation: Daughter   Goals of care: Advanced Directive information Advanced Directives 06/24/2018  Does Patient Have a Medical Advance Directive? No  Would patient like information on creating a medical advance directive? No - Patient declined     Chief Complaint  Patient presents with  . Acute Visit    Patient c/o of pain in back,neck and arm going on for week or more. Bought several creams,cbd cream, nsaids,  but no relief.    HPI:  Pt is a 68 y.o. female seen today for an acute visit for evaluation of back of the neck and left upper arm pain x 2 weeks.She states has taken ibuprofen and used creams without any relief.she also took some hydrocodone tablets that she had left over from her dental procedure.pain is described as throbbing and running down to her left upper arm.she denies any fever,chills, numbness,tingling or injury to neck/arm.she states unble to hold head up " feels like the head is heavy".    Past Medical History:  Diagnosis Date  . Arthritis   . Coronary artery disease   . H/O hiatal hernia   . Hepatitis    Hep C; dx 1 week ago 10/2113  . Hypertension   . Wears glasses    Past Surgical History:  Procedure Laterality Date  . BREAST LUMPECTOMY WITH RADIOACTIVE SEED LOCALIZATION Right 05/27/2014   Procedure: RIGHT BREAST LUMPECTOMY WITH RADIOACTIVE SEED LOCALIZATION;  Surgeon: Almond Lint, MD;  [facility];  Service: General;  Laterality: Right;  . BREAST SURGERY     breast biopsy-right, atypical cells  . COLONOSCOPY    . COLONOSCOPY WITH PROPOFOL N/A 10/27/2013   Procedure: COLONOSCOPY WITH PROPOFOL;  Surgeon:  Charolett Bumpers, MD;  Location: [facility];  Service: Endoscopy;  Laterality: N/A;  . ESOPHAGOGASTRODUODENOSCOPY (EGD) WITH PROPOFOL N/A 04/12/2014   Procedure: ESOPHAGOGASTRODUODENOSCOPY (EGD) WITH PROPOFOL;  Surgeon: Charolett Bumpers, MD;  Location: [facility];  Service: Endoscopy;  Laterality: N/A;  . SEPTOPLASTY  2005  . TUBAL LIGATION      No Known Allergies  Outpatient Encounter Medications as of 06/24/2018  Medication Sig  . CALCIUM CARB-ERGOCALCIFEROL PO Take by mouth. Calcium Xrtra: 1000 mg calcium, 400 iu vit D, 4,000 iu Vit A, 100 mg Vit C, 10 mg iron, and 500 mg magnesium. Take 3 by mouth daily with meal  . Cholecalciferol (VITAMIN D) 2000 units CAPS Take 2,000 Units by mouth daily.  . fluticasone (FLONASE) 50 MCG/ACT nasal spray PLACE 1 SPRAY INTO BOTH NOSTRILS DAILY. FOR ALLERGIC RHINITIS  . guaiFENesin (MUCINEX) 600 MG 12 hr tablet Take 600 mg by mouth 2 (two) times daily as needed.   . hydrochlorothiazide (HYDRODIURIL) 25 MG tablet Take 1 tablet (25 mg total) by mouth daily.  Marland Kitchen loratadine (CLARITIN) 10 MG tablet TAKE 1 TABLET (10 MG TOTAL) BY MOUTH DAILY. FOR SEASONAL ALLERGY  . losartan (COZAAR) 100 MG tablet Take 1 tablet (100 mg total) by mouth daily.  . milk thistle 175 MG tablet Take 175 mg by mouth 3 (three) times daily.  Marland Kitchen omeprazole (PRILOSEC) 20 MG capsule TAKE 1 CAPSULE (20 MG TOTAL) BY MOUTH DAILY AS NEEDED. FOR ACID REFLUX  .  acetaminophen (TYLENOL) 500 MG tablet Take 1 tablet (500 mg total) by mouth every 8 (eight) hours as needed for moderate pain.  Marland Kitchen. ketorolac (TORADOL) 30 MG/ML injection Inject 1 mL (30 mg total) into the muscle once for 1 dose.  . [DISCONTINUED] montelukast (SINGULAIR) 10 MG tablet Take 1 tablet (10 mg total) by mouth at bedtime. For seasonal allergy (Patient not taking: Reported on 01/07/2018)  . [DISCONTINUED] Probiotic Product (PROBIOTIC-10) CAPS Take 2 capsules by mouth daily.  . [DISCONTINUED] senna-docusate (SENNA-PLUS) 8.6-50 MG  tablet Take 1 tablet by mouth at bedtime as needed for mild constipation.   No facility-administered encounter medications on file as of 06/24/2018.     Review of Systems  Constitutional: Negative for chills, fatigue and fever.  HENT: Negative for congestion, rhinorrhea, sinus pressure, sinus pain, sneezing and sore throat.   Eyes: Negative for discharge, redness, itching and visual disturbance.  Respiratory: Negative for cough, chest tightness, shortness of breath and wheezing.   Musculoskeletal: Positive for neck pain. Negative for arthralgias, back pain, gait problem, myalgias and neck stiffness.  Skin: Negative for color change, pallor and rash.  Neurological: Negative for dizziness, weakness, light-headedness, numbness and headaches.  Psychiatric/Behavioral: Negative for agitation, confusion and sleep disturbance.    Immunization History  Administered Date(s) Administered  . Hepatitis A, Adult 07/07/2014, 01/18/2015  . Hepatitis B, adult 07/07/2014, 09/21/2014, 01/18/2015  . Influenza-Unspecified 03/11/2013, 04/13/2014  . Pneumococcal-Unspecified 06/11/2013   Pertinent  Health Maintenance Due  Topic Date Due  . DEXA SCAN  02/26/2017  . PNA vac Low Risk Adult (1 of 2 - PCV13) 02/26/2017  . INFLUENZA VACCINE  01/09/2018  . MAMMOGRAM  04/24/2018  . COLONOSCOPY  10/28/2023   Fall Risk  06/24/2018 09/24/2017 08/01/2017 07/25/2017 07/17/2017  Falls in the past year? 0 No No No No  Number falls in past yr: 0 - - - -  Injury with Fall? 0 - - - -   Functional Status Survey:    Vitals:   06/24/18 1449  BP: 110/80  Pulse: (!) 52  Temp: 98 F (36.7 C)  TempSrc: Oral  SpO2: 96%  Weight: 179 lb 6.4 oz (81.4 kg)  Height: 5\' 6"  (1.676 m)   Body mass index is 28.96 kg/m. Physical Exam Vitals signs reviewed.  Constitutional:      General: She is not in acute distress.    Appearance: Normal appearance. She is not ill-appearing.  HENT:     Head: Normocephalic.     Mouth/Throat:       Mouth: Mucous membranes are dry.     Pharynx: Oropharynx is clear. No oropharyngeal exudate or posterior oropharyngeal erythema.  Eyes:     General: No scleral icterus.       Right eye: No discharge.        Left eye: No discharge.     Conjunctiva/sclera: Conjunctivae normal.     Pupils: Pupils are equal, round, and reactive to light.  Neck:     Musculoskeletal: No edema, erythema, neck rigidity, crepitus, pain with movement, spinous process tenderness or muscular tenderness.     Comments: Decreased ROM when ask to look up in the ceiling.  Cardiovascular:     Rate and Rhythm: Normal rate and regular rhythm.     Pulses: Normal pulses.     Heart sounds: Normal heart sounds. No murmur. No friction rub. No gallop.   Pulmonary:     Effort: Pulmonary effort is normal. No respiratory distress.     Breath sounds:  Normal breath sounds. No wheezing, rhonchi or rales.  Abdominal:     General: Bowel sounds are normal. There is no distension.     Palpations: Abdomen is soft. There is no mass.     Tenderness: There is no abdominal tenderness. There is no right CVA tenderness, left CVA tenderness, guarding or rebound.  Musculoskeletal: Normal range of motion.        General: No swelling or tenderness.     Right lower leg: No edema.     Left lower leg: No edema.  Lymphadenopathy:     Cervical: No cervical adenopathy.  Skin:    General: Skin is warm and dry.     Coloration: Skin is not pale.     Findings: No erythema, lesion or rash.  Neurological:     Mental Status: She is alert and oriented to person, place, and time.     Sensory: No sensory deficit.     Motor: No weakness.     Coordination: Coordination normal.  Psychiatric:        Mood and Affect: Mood normal.        Behavior: Behavior normal.        Judgment: Judgment normal.    Labs reviewed: Recent Labs    07/17/17 1225 07/25/17 1530 01/07/18 1528  NA 141 140 139  K 3.6 3.7 3.8  CL 107 105 104  CO2 26 26 25   GLUCOSE 75  89 83  BUN 12 13 20   CREATININE 0.82 0.98 0.99  CALCIUM 9.5 9.4 10.2   Recent Labs    07/17/17 1225 07/25/17 1530 01/07/18 1528  AST 24 30 25   ALT 20 23 19   BILITOT 0.3 0.3 0.6  PROT 7.7 7.6 8.3*   Recent Labs    07/17/17 1225  WBC 8.4  NEUTROABS 3,965  HGB 13.0  HCT 38.4  MCV 88.7  PLT 219   Lab Results  Component Value Date   TSH 1.37 07/17/2017   Lab Results  Component Value Date   HGBA1C 6.3 (H) 01/07/2018   Lab Results  Component Value Date   CHOL 202 (H) 01/07/2018   HDL 60 01/07/2018   LDLCALC 114 (H) 01/07/2018   TRIG 164 (H) 01/07/2018   CHOLHDL 3.4 01/07/2018    Significant Diagnostic Results in last 30 days:  No results found.  Assessment/Plan 1. Cervical radiculopathy Neck pain with limited ROM with hyperextention of neck.Pain described as constant throbbing with radiation to left upper arm.Negative for numbness or tingling of fingers.Exam finding also negative for crepitus.Will obtain radiography.will need possible referral to orthopedic if pain worsen. Administer ketorolac 30 mg via I.M. x 1 dose now. Add Extra strength Tylenol 500 mg tablet one by mouth every 8 hours as needed for pain.   - DG Cervical Spine Complete; Future - Notify provider or go to ED if symptoms worsen or no relief.   2. Left upper arm pain Has full ROM.suspect possible cervical radiculopathy given pain radiation to upper arm.Ketorolac 30 mg I.M and extra strength Tylenol as above.  - DG Cervical Spine Complete; Future  Family/ staff Communication: Reviewed plan of care with patient.  Labs/tests ordered: - DG Cervical Spine Complete; Future Caesar Bookmaninah C Ngetich, NP

## 2018-07-03 DIAGNOSIS — Z6829 Body mass index (BMI) 29.0-29.9, adult: Secondary | ICD-10-CM | POA: Diagnosis not present

## 2018-07-03 DIAGNOSIS — Z1231 Encounter for screening mammogram for malignant neoplasm of breast: Secondary | ICD-10-CM | POA: Diagnosis not present

## 2018-07-03 DIAGNOSIS — N958 Other specified menopausal and perimenopausal disorders: Secondary | ICD-10-CM | POA: Diagnosis not present

## 2018-07-03 DIAGNOSIS — Z01419 Encounter for gynecological examination (general) (routine) without abnormal findings: Secondary | ICD-10-CM | POA: Diagnosis not present

## 2018-07-10 ENCOUNTER — Encounter: Payer: Self-pay | Admitting: Family

## 2018-07-10 ENCOUNTER — Ambulatory Visit (INDEPENDENT_AMBULATORY_CARE_PROVIDER_SITE_OTHER): Payer: Medicare HMO | Admitting: Family

## 2018-07-10 VITALS — BP 124/72 | HR 58 | Temp 97.9°F | Ht 66.0 in | Wt 177.4 lb

## 2018-07-10 DIAGNOSIS — K2100 Gastro-esophageal reflux disease with esophagitis, without bleeding: Secondary | ICD-10-CM

## 2018-07-10 DIAGNOSIS — K7682 Hepatic encephalopathy: Secondary | ICD-10-CM

## 2018-07-10 DIAGNOSIS — E782 Mixed hyperlipidemia: Secondary | ICD-10-CM

## 2018-07-10 DIAGNOSIS — M5412 Radiculopathy, cervical region: Secondary | ICD-10-CM | POA: Diagnosis not present

## 2018-07-10 DIAGNOSIS — K7290 Hepatic failure, unspecified without coma: Secondary | ICD-10-CM | POA: Diagnosis not present

## 2018-07-10 DIAGNOSIS — I10 Essential (primary) hypertension: Secondary | ICD-10-CM | POA: Diagnosis not present

## 2018-07-10 DIAGNOSIS — J309 Allergic rhinitis, unspecified: Secondary | ICD-10-CM

## 2018-07-10 MED ORDER — DICLOFENAC SODIUM 1 % TD GEL: 2.0000 g | Freq: Four times a day (QID) | TRANSDERMAL | 3 refills | Status: AC

## 2018-07-10 NOTE — Progress Notes (Signed)
Provider: Marlowe Sax FNP-C   Ej Pinson, Nelda Bucks, NP  Patient Care Team: [place] Scheiderer, Nelda Bucks, NP as PCP - General (Family Medicine)  Extended Emergency Contact Information Primary Emergency Contact: [facility] Address: [address]          [place], [place] [address] Johnnette Litter of [place] Phone: [phone] Relation: Daughter   Goals of care: Advanced Directive information Advanced Directives 07/10/2018  Does Patient Have a Medical Advance Directive? No  Would patient like information on creating a medical advance directive? No - Patient declined     Chief Complaint  Patient presents with  . Medical Management of Chronic Issues    6 month follow up, patient c/o of back and neck pain, patient states sister in law give her some hemp cream and it has helped a little.     HPI:  Pt is a 68 y.o. female seen today for medical management of chronic diseases.she states still having neck pain though states previously pain was 8 but now down to 3 on scale.Pain gets worst at times described as throbbing and shots down her left arm.she denies any numbness,tingling or weakness of hand. She has used CBD cream but did not help with pain.Also use heating pad with some relief.Cervical spine x-ray done 06/24/2018 showed mild degenerative change without acute abnormality.     Hypertension- on losartan 100 mg tablet daily and hydrochlorothiazide 25 mg tablet daily.she does not check her blood pressure.Denes any headache,dizziness,shortness of breath,or chest pain.  GERD - symptoms controlled as long as she takes omeprazole.she states takes omeprazole daily instead of as needed.   Allergic Rhinitis - Flonase spray effective.    Overweight  - states has been trying to eat salad daily.she does not exercise.     Immunization - due for zoster and Influenza vaccine but declines.   Past Medical History:  Diagnosis Date  . Arthritis   . Coronary artery disease   . H/O hiatal hernia   . Hepatitis     Hep C; dx 1 week ago 10/2113  . Hypertension   . Wears glasses    Past Surgical History:  Procedure Laterality Date  . BREAST LUMPECTOMY WITH RADIOACTIVE SEED LOCALIZATION Right 05/27/2014   Procedure: RIGHT BREAST LUMPECTOMY WITH RADIOACTIVE SEED LOCALIZATION;  Surgeon: Stark Klein, MD;  Location: [facility];  Service: General;  Laterality: Right;  . BREAST SURGERY     breast biopsy-right, atypical cells  . COLONOSCOPY    . COLONOSCOPY WITH PROPOFOL N/A 10/27/2013   Procedure: COLONOSCOPY WITH PROPOFOL;  Surgeon: Garlan Fair, MD;  Location: [facility];  Service: Endoscopy;  Laterality: N/A;  . ESOPHAGOGASTRODUODENOSCOPY (EGD) WITH PROPOFOL N/A 04/12/2014   Procedure: ESOPHAGOGASTRODUODENOSCOPY (EGD) WITH PROPOFOL;  Surgeon: Garlan Fair, MD;  Location: [facility];  Service: Endoscopy;  Laterality: N/A;  . SEPTOPLASTY  2005  . TUBAL LIGATION      No Known Allergies  Allergies as of 07/10/2018   No Known Allergies     Medication List       Accurate as of July 10, 2018  3:17 PM. Always use your most recent med list.        acetaminophen 500 MG tablet Commonly known as:  TYLENOL Take 1 tablet (500 mg total) by mouth every 8 (eight) hours as needed for moderate pain.   CALCIUM CARB-ERGOCALCIFEROL PO Take by mouth. Calcium Xrtra: 1000 mg calcium, 400 iu vit D, 4,000 iu Vit A, 100 mg Vit C, 10 mg iron, and  500 mg magnesium. Take 3 by mouth daily with meal   diclofenac sodium 1 % Gel Commonly known as:  VOLTAREN Apply 2 g topically 4 (four) times daily.   fluticasone 50 MCG/ACT nasal spray Commonly known as:  FLONASE PLACE 1 SPRAY INTO BOTH NOSTRILS DAILY. FOR ALLERGIC RHINITIS   guaiFENesin 600 MG 12 hr tablet Commonly known as:  MUCINEX Take 600 mg by mouth 2 (two) times daily as needed.   hydrochlorothiazide 25 MG tablet Commonly known as:  HYDRODIURIL Take 1 tablet (25 mg total) by mouth daily.   losartan 100 MG tablet Commonly  known as:  COZAAR Take 1 tablet (100 mg total) by mouth daily.   milk thistle 175 MG tablet Take 175 mg by mouth 3 (three) times daily.   omeprazole 20 MG capsule Commonly known as:  PRILOSEC TAKE 1 CAPSULE (20 MG TOTAL) BY MOUTH DAILY AS NEEDED. FOR ACID REFLUX   Vitamin D 50 MCG (2000 UT) Caps Take 2,000 Units by mouth daily.       Review of Systems  Constitutional: Negative for appetite change, chills, fatigue and fever.  HENT: Negative for congestion, rhinorrhea, sinus pressure, sinus pain, sneezing and sore throat.   Eyes: Negative for discharge, redness, itching and visual disturbance.  Respiratory: Negative for cough, chest tightness, shortness of breath and wheezing.   Cardiovascular: Negative for chest pain, palpitations and leg swelling.  Gastrointestinal: Negative for abdominal distention, abdominal pain, constipation, diarrhea, nausea and vomiting.  Endocrine: Negative for cold intolerance, heat intolerance, polydipsia, polyphagia and polyuria.  Genitourinary: Negative for dysuria, flank pain, frequency and urgency.  Musculoskeletal: Negative for gait problem and neck stiffness.       Neck pain per HPI   Skin: Negative for color change, pallor and rash.  Neurological: Negative for dizziness, weakness, light-headedness, numbness and headaches.  Psychiatric/Behavioral: Negative for agitation and sleep disturbance. The patient is not nervous/anxious.     Immunization History  Administered Date(s) Administered  . Hepatitis A, Adult 07/07/2014, 01/18/2015  . Hepatitis B, adult 07/07/2014, 09/21/2014, 01/18/2015  . Influenza-Unspecified 03/11/2013, 04/13/2014  . Pneumococcal-Unspecified 06/11/2013   Pertinent  Health Maintenance Due  Topic Date Due  . DEXA SCAN  02/26/2017  . PNA vac Low Risk Adult (1 of 2 - PCV13) 02/26/2017  . INFLUENZA VACCINE  01/09/2018  . MAMMOGRAM  04/24/2018  . COLONOSCOPY  10/28/2023   Fall Risk  07/10/2018 06/24/2018 09/24/2017 08/01/2017  07/25/2017  Falls in the past year? 0 0 No No No  Number falls in past yr: 0 0 - - -  Injury with Fall? 0 0 - - -    Vitals:   07/10/18 1423  BP: 124/72  Pulse: (!) 58  Temp: 97.9 F (36.6 C)  TempSrc: Oral  SpO2: 98%  Weight: 177 lb 6.4 oz (80.5 kg)  Height: _0  (1.676 m)   Body mass index is 28.63 kg/m. Physical Exam Vitals signs reviewed.  Constitutional:      General: She is not in acute distress.    Appearance: Normal appearance.  HENT:     Head: Normocephalic.     Right Ear: Tympanic membrane and external ear normal. There is no impacted cerumen.     Left Ear: Tympanic membrane, ear canal and external ear normal. There is no impacted cerumen.     Nose: Nose normal. No congestion or rhinorrhea.     Mouth/Throat:     Mouth: Mucous membranes are moist.     Pharynx: Oropharynx is clear.  No oropharyngeal exudate or posterior oropharyngeal erythema.  Eyes:     General: No scleral icterus.       Right eye: No discharge.        Left eye: No discharge.     Conjunctiva/sclera: Conjunctivae normal.     Pupils: Pupils are equal, round, and reactive to light.  Neck:     Musculoskeletal: No muscular tenderness.     Vascular: No carotid bruit.     Comments: Slight limited ROM with hyperextension of neck  Cardiovascular:     Rate and Rhythm: Normal rate and regular rhythm.     Pulses: Normal pulses.     Heart sounds: Normal heart sounds. No murmur. No friction rub. No gallop.   Pulmonary:     Effort: Pulmonary effort is normal. No respiratory distress.     Breath sounds: Normal breath sounds. No wheezing, rhonchi or rales.  Chest:     Chest wall: No tenderness.  Abdominal:     General: Bowel sounds are normal. There is no distension.     Palpations: Abdomen is soft. There is no mass.     Tenderness: There is no abdominal tenderness. There is no right CVA tenderness, left CVA tenderness, guarding or rebound.  Musculoskeletal: Normal range of motion.        General: No  tenderness.     Right lower leg: No edema.     Left lower leg: No edema.  Lymphadenopathy:     Cervical: No cervical adenopathy.  Skin:    General: Skin is warm and dry.     Coloration: Skin is not pale.     Findings: No bruising, erythema, lesion or rash.  Neurological:     Mental Status: She is alert and oriented to person, place, and time.     Cranial Nerves: No cranial nerve deficit.     Sensory: No sensory deficit.     Motor: No weakness.     Coordination: Coordination normal.     Gait: Gait normal.  Psychiatric:        Mood and Affect: Mood normal.        Behavior: Behavior normal.        Thought Content: Thought content normal.        Judgment: Judgment normal.    Labs reviewed: Recent Labs    07/17/17 1225 07/25/17 1530 01/07/18 1528  NA 141 140 139  K 3.6 3.7 3.8  CL 107 105 104  CO2 _0 GLUCOSE 75 89 83  BUN _1 CREATININE 0.82 0.98 0.99  CALCIUM 9.5 9.4 10.2   Recent Labs    07/17/17 1225 07/25/17 1530 01/07/18 1528  AST _2 ALT _3 BILITOT 0.3 0.3 0.6  PROT 7.7 7.6 8.3*   Recent Labs    07/17/17 1225  WBC 8.4  NEUTROABS 3,965  HGB 13.0  HCT 38.4  MCV 88.7  PLT 219   Lab Results  Component Value Date   TSH 1.37 07/17/2017   Lab Results  Component Value Date   HGBA1C 6.3 (H) 01/07/2018   Lab Results  Component Value Date   CHOL 202 (H) 01/07/2018   HDL 60 01/07/2018   LDLCALC 114 (H) 01/07/2018   TRIG 164 (H) 01/07/2018   CHOLHDL 3.4 01/07/2018    Significant Diagnostic Results in last 30 days:  Dg Cervical Spine Complete  Result Date: 06/24/2018 CLINICAL DATA:  Left-sided neck pain radiating into the left  arm for several weeks EXAM: CERVICAL SPINE - COMPLETE 4+ VIEW COMPARISON:  None. FINDINGS: Seven cervical segments are well visualized. Vertebral body height is well maintained. Osteophytic changes at C5-6 and C6-7 are noted. The neural foramina are patent bilaterally with the exception of C6-7  bilaterally. No acute fracture or acute facet abnormality is noted. The odontoid is within normal limits. IMPRESSION: Mild degenerative change without acute abnormality. Electronically Signed   By: Inez Catalina M.D.   On: 06/24/2018 21:50   Assessment/Plan 1. Essential hypertension B/p controlled.Continue on losartan 100 mg tablet daily and hydrochlorothiazide 25 mg tablet daily. - CBC with Differential/Platelet; Future - CMP with eGFR([facility]); Future - TSH; Future  2. Gastroesophageal reflux disease with esophagitis Symptoms stable on Omeprazole 20 mg capsule.encouraged to use omeprazole as needed.will attempt to wean off omeprazole.   3. Allergic rhinitis, unspecified seasonality, unspecified trigger Continue on fluticasone 50 mcg/nasal spray daily.   4. Cervical radiculopathy Recent x-ray showed mild degenerative changes.Ibuprofen,topical cream ineffective.start on Voltaren gel apply 2 Gm Topical to affected area on the neck four times daily.    - Ambulatory referral to Orthopedic Surgery  5. Mixed hyperlipidemia Latest LDL 114,TRG 164,chol 202 (01/07/2018)The cholesterol is elevated. She should follow a low fat, low cholesterol diet.  - Lipid Panel; Future  6. Overweight  BMI 28.63 recommended low carbohydrate,low saturated fats and high vegetable diet.Exercise regular exercise at least 30 minutes three times per week. 7. Hepatic encepalopathy CMP ordered today.   Family/ staff Communication: Reviewed plan of care with patient   Labs/tests ordered:CBC/diff,CMP,TSH and lipid panel,future.   Follow up: 4 months  Sandrea Hughs, NP

## 2018-07-10 NOTE — Patient Instructions (Signed)
Labs ordered today please get drawn.

## 2018-07-16 ENCOUNTER — Ambulatory Visit (INDEPENDENT_AMBULATORY_CARE_PROVIDER_SITE_OTHER): Payer: Medicare HMO | Admitting: Family Medicine

## 2018-07-17 ENCOUNTER — Other Ambulatory Visit: Payer: Medicare HMO

## 2018-07-23 ENCOUNTER — Telehealth: Payer: Self-pay | Admitting: *Deleted

## 2018-07-23 NOTE — Telephone Encounter (Signed)
Received Prior Authorization Fax from [facility] #[phone] for Diclofenac Sodium 1% Gel.  Placed in Dinah's folder to fill out and sign. To be faxed back to [place] Fax: [phone]

## 2018-07-24 NOTE — Telephone Encounter (Signed)
Received fax from [facility] and Diclofenac is APPROVED 06/09/18-06/11/19 [id number]

## 2018-09-22 DIAGNOSIS — Z0100 Encounter for examination of eyes and vision without abnormal findings: Secondary | ICD-10-CM | POA: Diagnosis not present

## 2018-09-24 IMAGING — CR DG THORACIC SPINE 3V
3 series · 3 of 3 positions shown · non-contrast
Comparison: 03/15/2014.

CLINICAL DATA: Pain.  No trauma.

EXAM:
THORACIC SPINE - 3 VIEWS

[w thoracic spine ap]
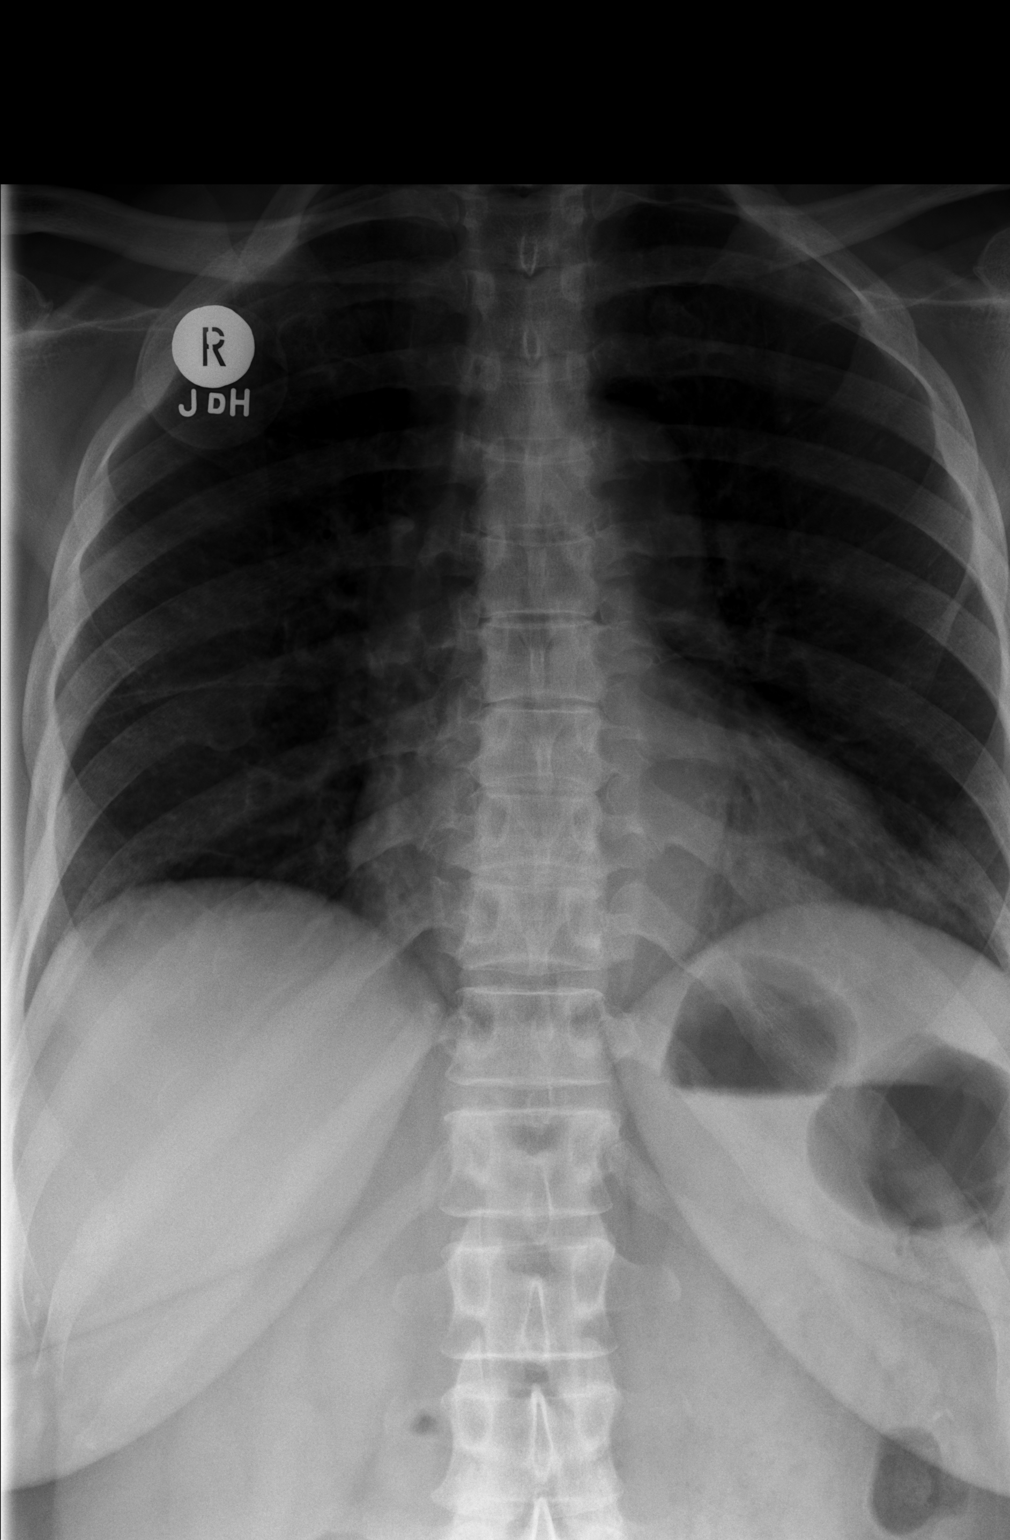

[w thoracic spine lat]
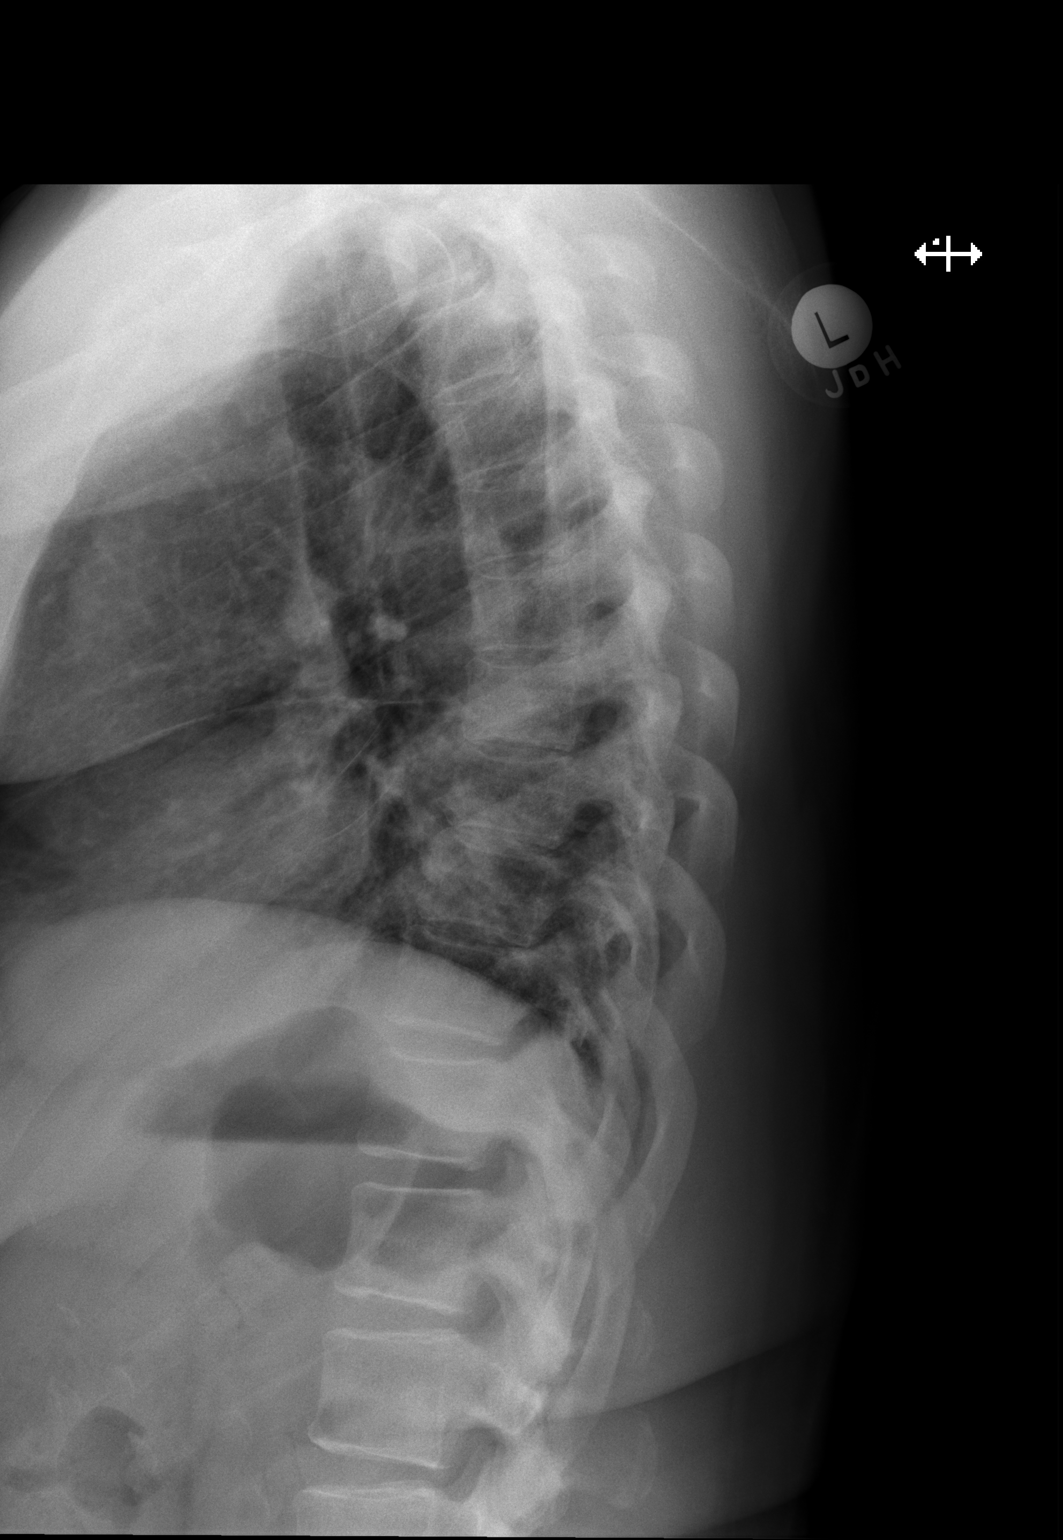

[w thoracic swimmers]
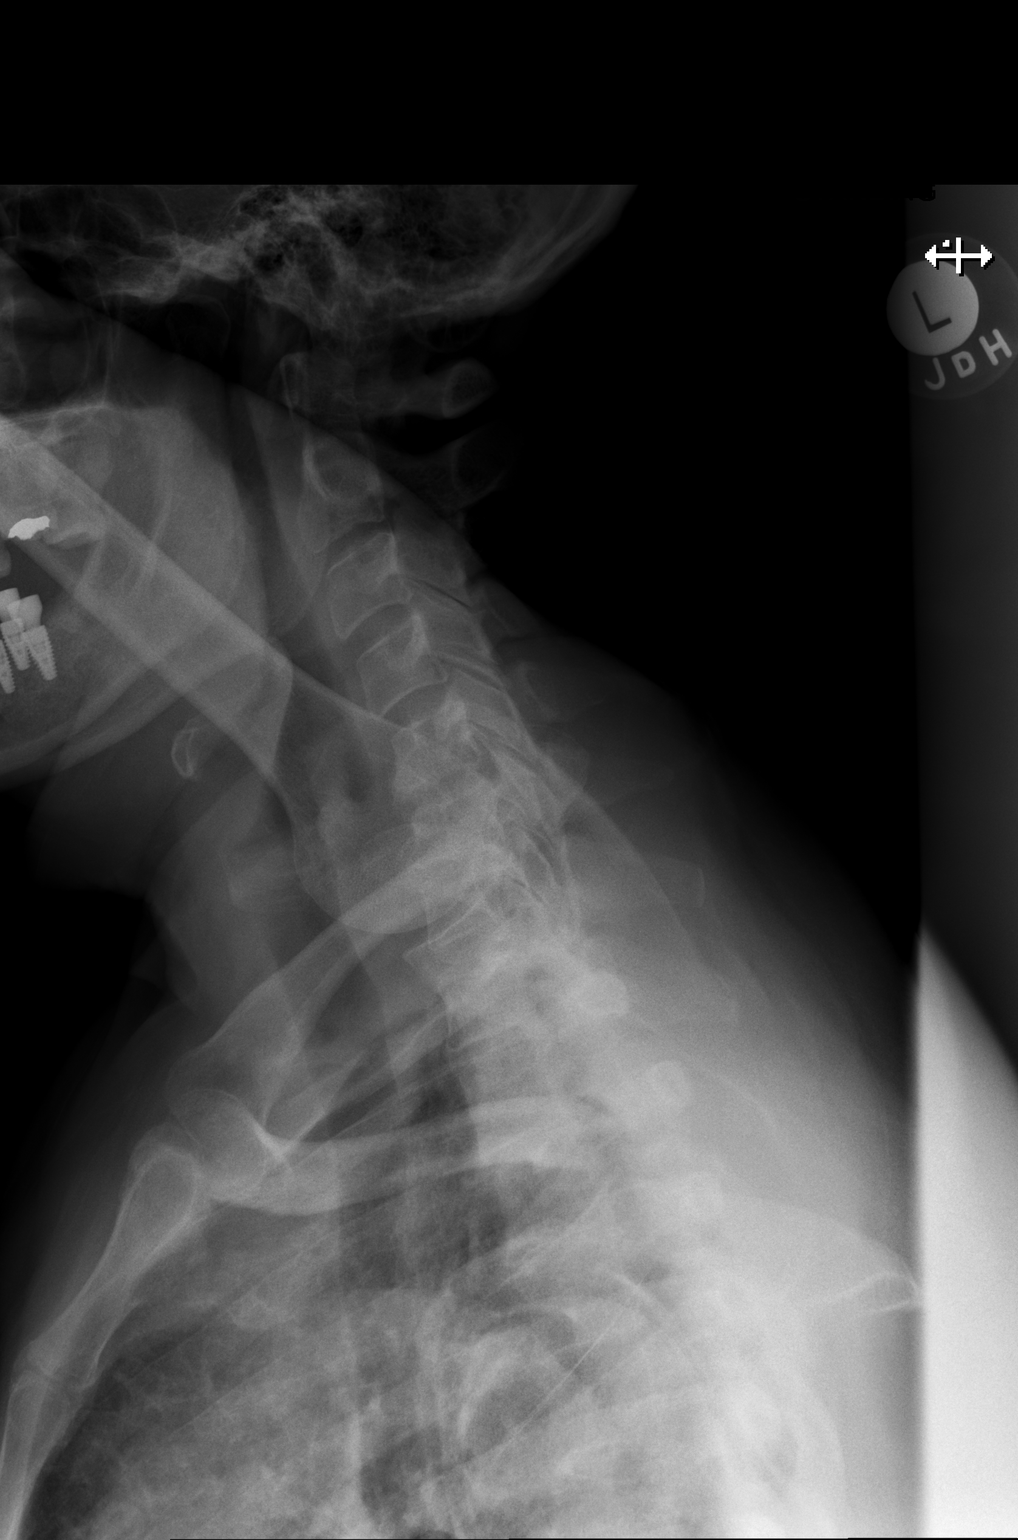

[3 of 3 positions shown; findings below may reference images not displayed]

FINDINGS: No acute soft tissue or bony abnormality. No evidence of fracture or
dislocation. Mild scoliosis. Paraspinal soft tissues are normal.
IMPRESSION: No acute soft tissue bony abnormality. Mild thoracic spine
scoliosis.

## 2018-10-10 DIAGNOSIS — Z87891 Personal history of nicotine dependence: Secondary | ICD-10-CM | POA: Diagnosis not present

## 2018-10-10 DIAGNOSIS — R32 Unspecified urinary incontinence: Secondary | ICD-10-CM | POA: Diagnosis not present

## 2018-10-10 DIAGNOSIS — K219 Gastro-esophageal reflux disease without esophagitis: Secondary | ICD-10-CM | POA: Diagnosis not present

## 2018-10-10 DIAGNOSIS — I10 Essential (primary) hypertension: Secondary | ICD-10-CM | POA: Diagnosis not present

## 2018-10-20 ENCOUNTER — Other Ambulatory Visit: Payer: Self-pay | Admitting: *Deleted

## 2018-10-20 DIAGNOSIS — K2100 Gastro-esophageal reflux disease with esophagitis, without bleeding: Secondary | ICD-10-CM

## 2018-10-20 MED ORDER — OMEPRAZOLE 20 MG PO CPDR: 20.0000 mg | DELAYED_RELEASE_CAPSULE | Freq: Every day | ORAL | 1 refills | Status: DC | PRN

## 2018-10-20 NOTE — Telephone Encounter (Signed)
[facility] 

## 2018-11-02 DIAGNOSIS — Z0100 Encounter for examination of eyes and vision without abnormal findings: Secondary | ICD-10-CM | POA: Diagnosis not present

## 2018-11-07 ENCOUNTER — Ambulatory Visit: Payer: Medicare HMO | Admitting: Family

## 2018-12-26 IMAGING — CR DG CHEST 2V
2 series · 2 of 2 positions shown · non-contrast
Comparison: Chest radiograph 10/23/2017

CLINICAL DATA: Patient with chest pain.

EXAM:
CHEST - 2 VIEW

[w chest pa]
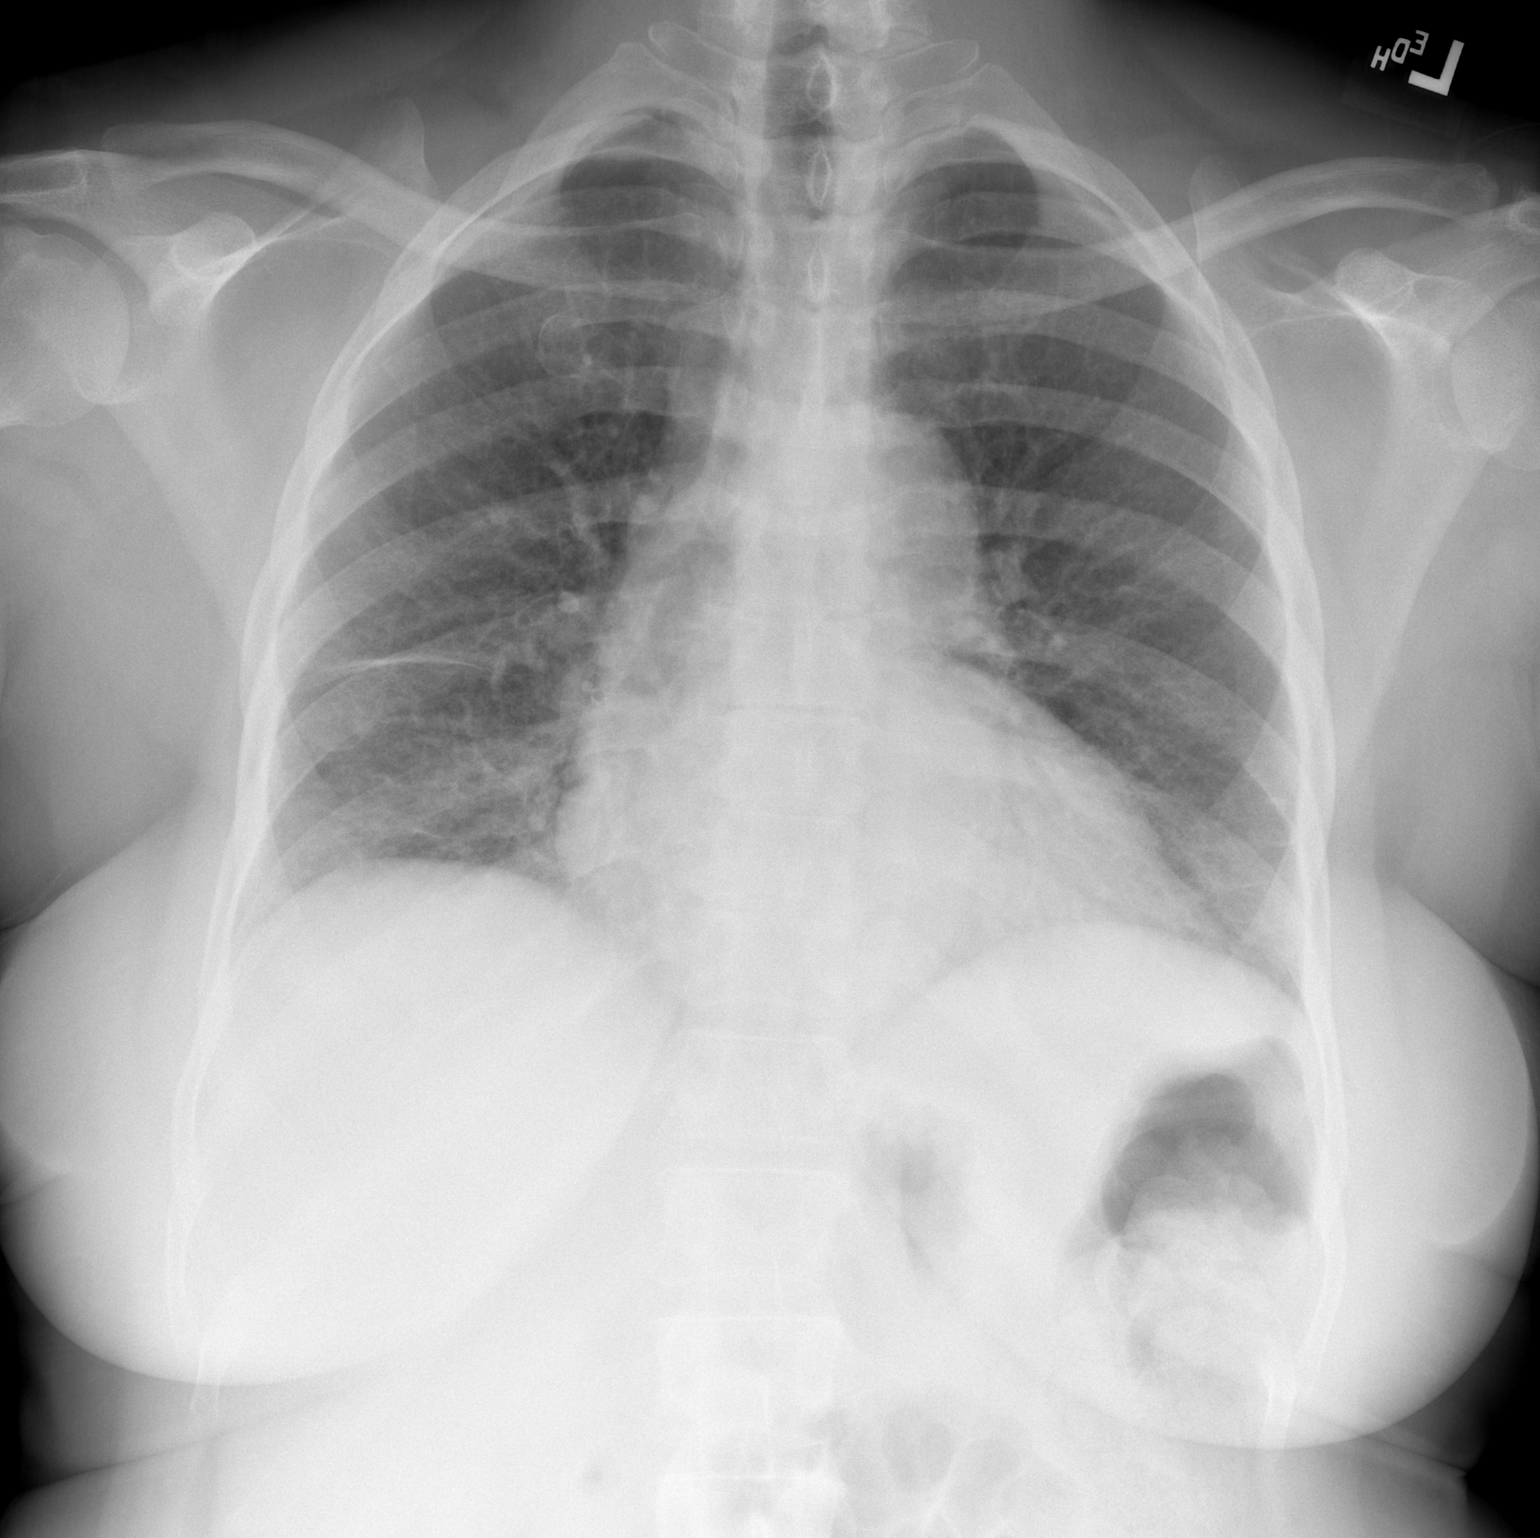

[w chest lat]
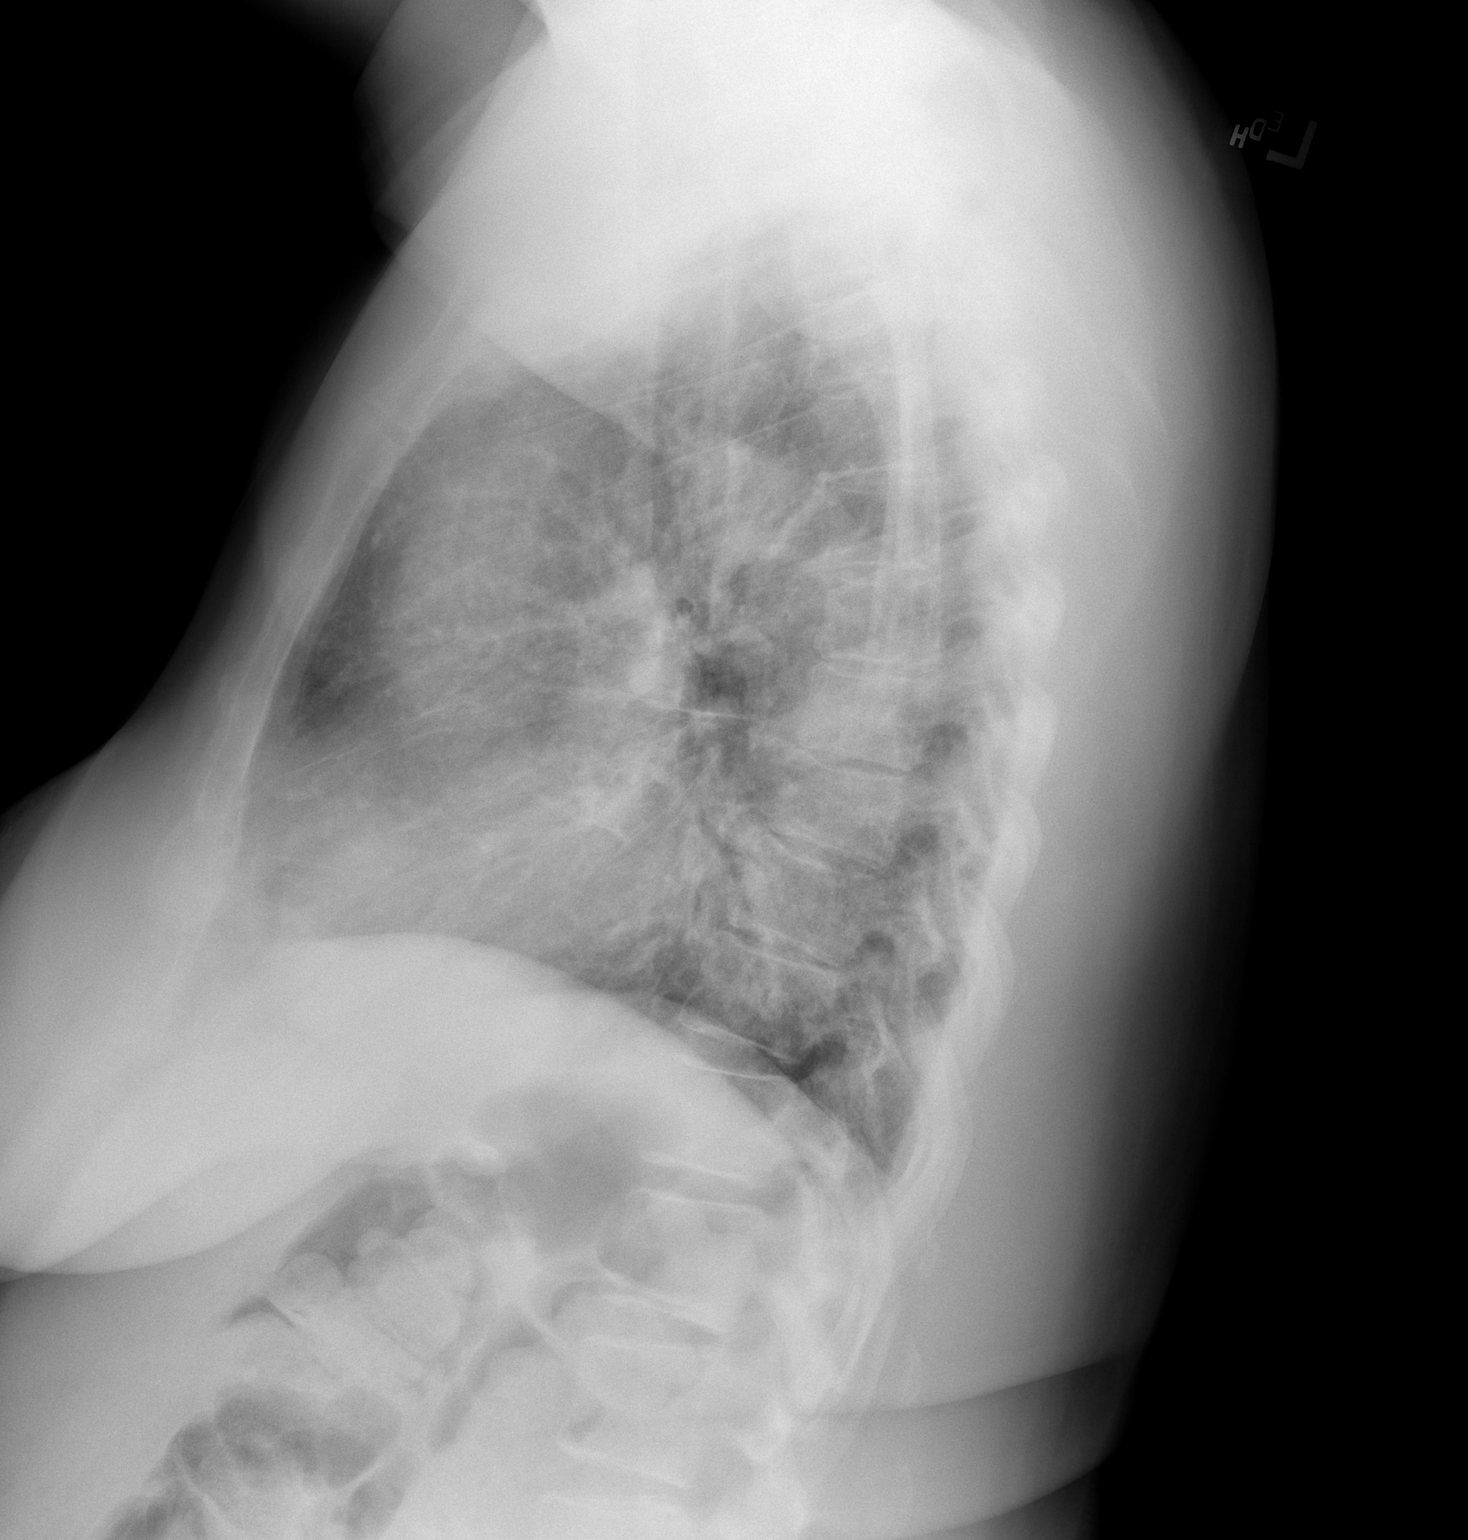

[2 of 2 positions shown; findings below may reference images not displayed]

FINDINGS: Stable cardiomegaly. Tortuosity of the thoracic aorta. Bilateral
interstitial pulmonary opacities. No pleural effusion or
pneumothorax. Thoracic spine degenerative changes.
IMPRESSION: Bilateral interstitial opacities may represent mild edema or
atypical infection.

## 2019-03-10 DIAGNOSIS — L811 Chloasma: Secondary | ICD-10-CM | POA: Diagnosis not present

## 2019-03-10 DIAGNOSIS — L308 Other specified dermatitis: Secondary | ICD-10-CM | POA: Diagnosis not present

## 2019-04-29 ENCOUNTER — Ambulatory Visit (INDEPENDENT_AMBULATORY_CARE_PROVIDER_SITE_OTHER): Payer: Medicare HMO | Admitting: Family Medicine

## 2019-04-29 ENCOUNTER — Other Ambulatory Visit: Payer: Self-pay

## 2019-04-29 ENCOUNTER — Encounter: Payer: Self-pay | Admitting: Family Medicine

## 2019-04-29 ENCOUNTER — Ambulatory Visit: Payer: Medicare HMO | Admitting: Family Medicine

## 2019-04-29 VITALS — BP 134/84 | HR 82 | Temp 96.0°F | Ht 65.5 in | Wt 173.0 lb

## 2019-04-29 DIAGNOSIS — I10 Essential (primary) hypertension: Secondary | ICD-10-CM | POA: Diagnosis not present

## 2019-04-29 DIAGNOSIS — F17210 Nicotine dependence, cigarettes, uncomplicated: Secondary | ICD-10-CM | POA: Diagnosis not present

## 2019-04-29 DIAGNOSIS — G629 Polyneuropathy, unspecified: Secondary | ICD-10-CM

## 2019-04-29 DIAGNOSIS — Z8619 Personal history of other infectious and parasitic diseases: Secondary | ICD-10-CM | POA: Diagnosis not present

## 2019-04-29 DIAGNOSIS — R69 Illness, unspecified: Secondary | ICD-10-CM | POA: Diagnosis not present

## 2019-04-29 DIAGNOSIS — Z7689 Persons encountering health services in other specified circumstances: Secondary | ICD-10-CM

## 2019-04-29 NOTE — Patient Instructions (Signed)
Managing Your Hypertension Hypertension is commonly called high blood pressure. This is when the force of your blood pressing against the walls of your arteries is too strong. Arteries are blood vessels that carry blood from your heart throughout your body. Hypertension forces the heart to work harder to pump blood, and may cause the arteries to become narrow or stiff. Having untreated or uncontrolled hypertension can cause heart attack, stroke, kidney disease, and other problems. What are blood pressure readings? A blood pressure reading consists of a higher number over a lower number. Ideally, your blood pressure should be below 120/80. The first ("top") number is called the systolic pressure. It is a measure of the pressure in your arteries as your heart beats. The second ("bottom") number is called the diastolic pressure. It is a measure of the pressure in your arteries as the heart relaxes. What does my blood pressure reading mean? Blood pressure is classified into four stages. Based on your blood pressure reading, your health care provider may use the following stages to determine what type of treatment you need, if any. Systolic pressure and diastolic pressure are measured in a unit called mm Hg. Normal  Systolic pressure: below 784.  Diastolic pressure: below 80. Elevated  Systolic pressure: 696-295.  Diastolic pressure: below 80. Hypertension stage 1  Systolic pressure: 284-132.  Diastolic pressure: 44-01. Hypertension stage 2  Systolic pressure: 027 or above.  Diastolic pressure: 90 or above. What health risks are associated with hypertension? Managing your hypertension is an important responsibility. Uncontrolled hypertension can lead to:  A heart attack.  A stroke.  A weakened blood vessel (aneurysm).  Heart failure.  Kidney damage.  Eye damage.  Metabolic syndrome.  Memory and concentration problems. What changes can I make to manage my hypertension?  Hypertension can be managed by making lifestyle changes and possibly by taking medicines. Your health care provider will help you make a plan to bring your blood pressure within a normal range. Eating and drinking   Eat a diet that is high in fiber and potassium, and low in salt (sodium), added sugar, and fat. An example eating plan is called the DASH (Dietary Approaches to Stop Hypertension) diet. To eat this way: ? Eat plenty of fresh fruits and vegetables. Try to fill half of your plate at each meal with fruits and vegetables. ? Eat whole grains, such as whole wheat pasta, brown rice, or whole grain bread. Fill about one quarter of your plate with whole grains. ? Eat low-fat diary products. ? Avoid fatty cuts of meat, processed or cured meats, and poultry with skin. Fill about one quarter of your plate with lean proteins such as fish, chicken without skin, beans, eggs, and tofu. ? Avoid premade and processed foods. These tend to be higher in sodium, added sugar, and fat.  Reduce your daily sodium intake. Most people with hypertension should eat less than 1,500 mg of sodium a day.  Limit alcohol intake to no more than 1 drink a day for nonpregnant women and 2 drinks a day for men. One drink equals 12 oz of beer, 5 oz of wine, or 1 oz of hard liquor. Lifestyle  Work with your health care provider to maintain a healthy body weight, or to lose weight. Ask what an ideal weight is for you.  Get at least 30 minutes of exercise that causes your heart to beat faster (aerobic exercise) most days of the week. Activities may include walking, swimming, or biking.  Include exercise  to strengthen your muscles (resistance exercise), such as weight lifting, as part of your weekly exercise routine. Try to do these types of exercises for 30 minutes at least 3 days a week.  Do not use any products that contain nicotine or tobacco, such as cigarettes and e-cigarettes. If you need help quitting, ask your health  care provider.  Control any long-term (chronic) conditions you have, such as high cholesterol or diabetes. Monitoring  Monitor your blood pressure at home as told by your health care provider. Your personal target blood pressure may vary depending on your medical conditions, your age, and other factors.  Have your blood pressure checked regularly, as often as told by your health care provider. Working with your health care provider  Review all the medicines you take with your health care provider because there may be side effects or interactions.  Talk with your health care provider about your diet, exercise habits, and other lifestyle factors that may be contributing to hypertension.  Visit your health care provider regularly. Your health care provider can help you create and adjust your plan for managing hypertension. Will I need medicine to control my blood pressure? Your health care provider may prescribe medicine if lifestyle changes are not enough to get your blood pressure under control, and if:  Your systolic blood pressure is 130 or higher.  Your diastolic blood pressure is 80 or higher. Take medicines only as told by your health care provider. Follow the directions carefully. Blood pressure medicines must be taken as prescribed. The medicine does not work as well when you skip doses. Skipping doses also puts you at risk for problems. Contact a health care provider if:  You think you are having a reaction to medicines you have taken.  You have repeated (recurrent) headaches.  You feel dizzy.  You have swelling in your ankles.  You have trouble with your vision. Get help right away if:  You develop a severe headache or confusion.  You have unusual weakness or numbness, or you feel faint.  You have severe pain in your chest or abdomen.  You vomit repeatedly.  You have trouble breathing. Summary  Hypertension is when the force of blood pumping through your arteries  is too strong. If this condition is not controlled, it may put you at risk for serious complications.  Your personal target blood pressure may vary depending on your medical conditions, your age, and other factors. For most people, a normal blood pressure is less than 120/80.  Hypertension is managed by lifestyle changes, medicines, or both. Lifestyle changes include weight loss, eating a healthy, low-sodium diet, exercising more, and limiting alcohol. This information is not intended to replace advice given to you by your health care provider. Make sure you discuss any questions you have with your health care provider. Document Released: 02/20/2012 Document Revised: 09/19/2018 Document Reviewed: 04/25/2016 Elsevier Patient Education  2020 [employer]  Peripheral Neuropathy Peripheral neuropathy is a type of nerve damage. It affects nerves that carry signals between the spinal cord and the arms, legs, and the rest of the body (peripheral nerves). It does not affect nerves in the spinal cord or brain. In peripheral neuropathy, one nerve or a group of nerves may be damaged. Peripheral neuropathy is a broad category that includes many specific nerve disorders, like diabetic neuropathy, hereditary neuropathy, and carpal tunnel syndrome. What are the causes? This condition may be caused by:  Diabetes. This is the most common cause of peripheral neuropathy.  Nerve injury.  Pressure or stress on a nerve that lasts a long time.  Lack (deficiency) of B vitamins. This can result from alcoholism, poor diet, or a restricted diet.  Infections.  Autoimmune diseases, such as rheumatoid arthritis and systemic lupus erythematosus.  Nerve diseases that are passed from parent to child (inherited).  Some medicines, such as cancer medicines (chemotherapy).  Poisonous (toxic) substances, such as lead and mercury.  Too little blood flowing to the legs.  Kidney disease.  Thyroid disease. In some  cases, the cause of this condition is not known. What are the signs or symptoms? Symptoms of this condition depend on which of your nerves is damaged. Common symptoms include:  Loss of feeling (numbness) in the feet, hands, or both.  Tingling in the feet, hands, or both.  Burning pain.  Very sensitive skin.  Weakness.  Not being able to move a part of the body (paralysis).  Muscle twitching.  Clumsiness or poor coordination.  Loss of balance.  Not being able to control your bladder.  Feeling dizzy.  Sexual problems. How is this diagnosed? Diagnosing and finding the cause of peripheral neuropathy can be difficult. Your health care provider will take your medical history and do a physical exam. A neurological exam will also be done. This involves checking things that are affected by your brain, spinal cord, and nerves (nervous system). For example, your health care provider will check your reflexes, how you move, and what you can feel. You may have other tests, such as:  Blood tests.  Electromyogram (EMG) and nerve conduction tests. These tests check nerve function and how well the nerves are controlling the muscles.  Imaging tests, such as CT scans or MRI to rule out other causes of your symptoms.  Removing a small piece of nerve to be examined in a lab (nerve biopsy). This is rare.  Removing and examining a small amount of the fluid that surrounds the brain and spinal cord (lumbar puncture). This is rare. How is this treated? Treatment for this condition may involve:  Treating the underlying cause of the neuropathy, such as diabetes, kidney disease, or vitamin deficiencies.  Stopping medicines that can cause neuropathy, such as chemotherapy.  Medicine to relieve pain. Medicines may include: ? Prescription or over-the-counter pain medicine. ? Antiseizure medicine. ? Antidepressants. ? Pain-relieving patches that are applied to painful areas of skin.  Surgery to  relieve pressure on a nerve or to destroy a nerve that is causing pain.  Physical therapy to help improve movement and balance.  Devices to help you move around (assistive devices). Follow these instructions at home: Medicines  Take over-the-counter and prescription medicines only as told by your health care provider. Do not take any other medicines without first asking your health care provider.  Do not drive or use heavy machinery while taking prescription pain medicine. Lifestyle   Do not use any products that contain nicotine or tobacco, such as cigarettes and e-cigarettes. Smoking keeps blood from reaching damaged nerves. If you need help quitting, ask your health care provider.  Avoid or limit alcohol. Too much alcohol can cause a vitamin B deficiency, and vitamin B is needed for healthy nerves.  Eat a healthy diet. This includes: ? Eating foods that are high in fiber, such as fresh fruits and vegetables, whole grains, and beans. ? Limiting foods that are high in fat and processed sugars, such as fried or sweet foods. General instructions   If you have diabetes, work closely  with your health care provider to keep your blood sugar under control.  If you have numbness in your feet: ? Check every day for signs of injury or infection. Watch for redness, warmth, and swelling. ? Wear padded socks and comfortable shoes. These help protect your feet.  Develop a good support system. Living with peripheral neuropathy can be stressful. Consider talking with a mental health specialist or joining a support group.  Use assistive devices and attend physical therapy as told by your health care provider. This may include using a walker or a cane.  Keep all follow-up visits as told by your health care provider. This is important. Contact a health care provider if:  You have new signs or symptoms of peripheral neuropathy.  You are struggling emotionally from dealing with peripheral  neuropathy.  Your pain is not well-controlled. Get help right away if:  You have an injury or infection that is not healing normally.  You develop new weakness in an arm or leg.  You fall frequently. Summary  Peripheral neuropathy is when the nerves in the arms, or legs are damaged, resulting in numbness, weakness, or pain.  There are many causes of peripheral neuropathy, including diabetes, pinched nerves, vitamin deficiencies, autoimmune disease, and hereditary conditions.  Diagnosing and finding the cause of peripheral neuropathy can be difficult. Your health care provider will take your medical history, do a physical exam, and do tests, including blood tests and nerve function tests.  Treatment involves treating the underlying cause of the neuropathy and taking medicines to help control pain. Physical therapy and assistive devices may also help. This information is not intended to replace advice given to you by your health care provider. Make sure you discuss any questions you have with your health care provider. Document Released: 05/18/2002 Document Revised: 05/10/2017 Document Reviewed: 08/06/2016 Elsevier Patient Education  2020 [employer].

## 2019-04-29 NOTE — Progress Notes (Signed)
Patient presents to clinic today to f/u on chronic issues and establish care.  SUBJECTIVE: PMH: Pt is a 68 yo female with pmh sig for arthritis, GERD, h/o Hep C, HTN.Marland Kitchen  Pt previously seen by Patricia Blade, NP.  Pt initially says she never had a PCP, then say she was seen by Patricia Boys, DO.  Pt  then states was last seen 8 months ago.  GERD: -taking omeprazole -Does not recall foods that cause symptoms  HTN: Patient taking losartan-hydrochlorothiazide 100-25 mg daily -Denies headaches, chest pain, changes in vision -Not currently exercising -Not checking BP at home.  History of hep C? -Diagnosed in 2015 -Unsure if headache treatment  Tobacco use: -Patient smoking cigarettes daily  Chronic back pain? -Endorses pain in center of back -Also mentions congestion in chest regards to back pain  Past surgical history: Breast biopsy 2016  Social history: Patient is single.  Patient was employed as a Secretary/administrator.  Patient endorses alcohol and tobacco use.  Patient denies drug use.  Health Maintenance: TB skin test 2015 Colonoscopy --2015 Mammogram --07/03/18 PAP --2019 Dexa 07/03/18  Family medical history: Mom-deceased, diabetes, HTN, stroke Dad-deceased, heart disease Brother-Edward, alive, diabetes, kidney disease Brother-John, alive, kidney disease MGM-deceased, arthritis  Past Medical History:  Diagnosis Date  . Arthritis   . Coronary artery disease   . H/O hiatal hernia   . Hepatitis    Hep C; dx 1 week ago 10/2113  . Hypertension   . Wears glasses     Past Surgical History:  Procedure Laterality Date  . BREAST LUMPECTOMY WITH RADIOACTIVE SEED LOCALIZATION Right 05/27/2014   Procedure: RIGHT BREAST LUMPECTOMY WITH RADIOACTIVE SEED LOCALIZATION;  Surgeon: Almond Lint, MD;  Location: [facility];  Service: General;  Laterality: Right;  . BREAST SURGERY     breast biopsy-right, atypical cells  . COLONOSCOPY    . COLONOSCOPY WITH PROPOFOL  N/A 10/27/2013   Procedure: COLONOSCOPY WITH PROPOFOL;  Surgeon: Charolett Bumpers, MD;  Location: [facility];  Service: Endoscopy;  Laterality: N/A;  . ESOPHAGOGASTRODUODENOSCOPY (EGD) WITH PROPOFOL N/A 04/12/2014   Procedure: ESOPHAGOGASTRODUODENOSCOPY (EGD) WITH PROPOFOL;  Surgeon: Charolett Bumpers, MD;  Location: [facility];  Service: Endoscopy;  Laterality: N/A;  . SEPTOPLASTY  2005  . TUBAL LIGATION      Current Outpatient Medications on File Prior to Visit  Medication Sig Dispense Refill  . BLACK CURRANT SEED OIL PO Take by mouth.    Marland Kitchen CALCIUM CARB-ERGOCALCIFEROL PO Take by mouth. Calcium Xrtra: 1000 mg calcium, 400 iu vit D, 4,000 iu Vit A, 100 mg Vit C, 10 mg iron, and 500 mg magnesium. Take 3 by mouth daily with meal    . Cholecalciferol (VITAMIN D) 2000 units CAPS Take 2,000 Units by mouth daily.    . diclofenac sodium (VOLTAREN) 1 % GEL Apply 2 g topically 4 (four) times daily. 1 Tube 3  . fluticasone (FLONASE) 50 MCG/ACT nasal spray PLACE 1 SPRAY INTO BOTH NOSTRILS DAILY. FOR ALLERGIC RHINITIS 16 g 2  . guaiFENesin (MUCINEX) 600 MG 12 hr tablet Take 600 mg by mouth 2 (two) times daily as needed.     . hydrochlorothiazide (HYDRODIURIL) 25 MG tablet Take 1 tablet (25 mg total) by mouth daily. 90 tablet 3  . losartan (COZAAR) 100 MG tablet Take 1 tablet (100 mg total) by mouth daily. 90 tablet 3  . milk thistle 175 MG tablet Take 175 mg by mouth 3 (three) times daily.    Marland Kitchen  omeprazole (PRILOSEC) 20 MG capsule Take 1 capsule (20 mg total) by mouth daily as needed. For acid reflux 90 capsule 1  . UNABLE TO FIND Med Name: Cell Food Capsules    . acetaminophen (TYLENOL) 500 MG tablet Take 1 tablet (500 mg total) by mouth every 8 (eight) hours as needed for moderate pain. (Patient not taking: Reported on 07/10/2018) 30 tablet 0   No current facility-administered medications on file prior to visit.     No Known Allergies  Family History  Problem Relation Age of Onset  . Diabetes  Mellitus I Mother   . Stroke Mother   . Coronary artery disease Father   . Heart disease Father   . Diabetes Sister   . Kidney disease Sister   . Heart disease Brother   . Pancreatitis Brother   . Kidney disease Brother     Social History   Socioeconomic History  . Marital status: Single    Spouse name: Not on file  . Number of children: 1  . Years of education: 45  . Highest education level: Not on file  Occupational History  . Not on file  Social Needs  . Financial resource strain: Not on file  . Food insecurity    Worry: Not on file    Inability: Not on file  . Transportation needs    Medical: Not on file    Non-medical: Not on file  Tobacco Use  . Smoking status: Former Smoker    Packs/day: 0.25    Years: 20.00    Pack years: 5.00    Types: Cigarettes    Quit date: 05/25/2013    Years since quitting: 5.9  . Smokeless tobacco: Never Used  Substance and Sexual Activity  . Alcohol use: No    Alcohol/week: 0.0 standard drinks    Comment: last -occ   . Drug use: No  . Sexual activity: Never    Comment: stopped-  Lifestyle  . Physical activity    Days per week: Not on file    Minutes per session: Not on file  . Stress: Not on file  Relationships  . Social Herbalist on phone: Not on file    Gets together: Not on file    Attends religious service: Not on file    Active member of club or organization: Not on file    Attends meetings of clubs or organizations: Not on file    Relationship status: Not on file  . Intimate partner violence    Fear of current or ex partner: Not on file    Emotionally abused: Not on file    Physically abused: Not on file    Forced sexual activity: Not on file  Other Topics Concern  . Not on file  Social History Narrative   Current smoker- 5/day   Alcohol use - Occasionally   Caffeine- coffee/tea   Marital- Single   House-1 story, lives alone. Pets-No   Highest level of education- [employer]   Current or  past Psychiatrist, Therapist, art   Exercise- NO   Fun: Not much fun right now.    Denies abuse and feels safe at home.     ROS General: Denies fever, chills, night sweats, changes in weight, changes in appetite HEENT: Denies headaches, ear pain, changes in vision, rhinorrhea, sore throat CV: Denies CP, palpitations, SOB, orthopnea Pulm: Denies SOB, cough, wheezing  + chest congestion GI: Denies abdominal pain, nausea, vomiting, diarrhea, constipation GU:  Denies dysuria, hematuria, frequency, vaginal discharge Msk: Denies muscle cramps, joint pains  + back pain Neuro: Denies weakness, numbness, tingling Skin: Denies rashes, bruising Psych: Denies depression, anxiety, hallucinations  BP 134/84 (BP Location: Right Arm, Patient Position: Sitting, Cuff Size: Normal)   Pulse 82   Temp (!) 96 F (35.6 C) (Temporal)   Ht 5' 5.5" (1.664 m)   Wt 173 lb (78.5 kg)   SpO2 98%   BMI 28.35 kg/m   Physical Exam Gen. Pleasant, well developed, poor historian, well-nourished, in NAD HEENT - Echo/AT, PERRL, EOMI, no scleral icterus, no nasal drainage, pharynx without erythema or exudate. Lungs: no use of accessory muscles, CTAB, no wheezes, rales or rhonchi Cardiovascular: RRR, No r/g/m, no peripheral edema Musculoskeletal: No deformities, moves all four extremities, no cyanosis or clubbing, normal tone Neuro:  A&Ox3, CN II-XII intact, normal gait Skin:  Warm, dry, intact, no lesions  No results found for this or any previous visit (from the past 2160 hour(s)).  Assessment/Plan: Essential hypertension -Elevated -Patient encouraged to take losartan-hydrochlorothiazide 100-25 mg daily -Discussed obtaining labs including CMP -Patient encouraged to increase physical activity, increase p.o. intake of water, and decrease intake of salt -Patient encouraged to obtain BP cuff, monitor BP at home, and keep a log to bring to clinic  Neuropathy -Discussed various causes -We will  obtain prior records in regards to next steps -Consider labs, gabapentin, EMG/nerve conduction studies  History of hepatitis C -discussed pain records -We will refer to GI if not previously treated. -We will continue to monitor  Cigarette nicotine dependence, uncomplicated -Smoking cessation counseling greater than 3 minutes, less than 10 minutes -Patient encouraged to decrease the amount of cigarettes smoked per day -Consider medication options -We will continue to monitor each visit  Encounter to establish care -We reviewed the PMH, PSH, FH, SH, Meds and Allergies. -We provided refills for any medications we will prescribe as needed. -We addressed current concerns per orders and patient instructions. -We have asked for records for pertinent exams, studies, vaccines and notes from previous providers. -We have advised patient to follow up per instructions below.  F/u prn  Abbe AmsterdamShannon Jessenya Berdan, MD

## 2019-05-04 ENCOUNTER — Encounter: Payer: Self-pay | Admitting: Family Medicine

## 2019-05-04 DIAGNOSIS — G629 Polyneuropathy, unspecified: Secondary | ICD-10-CM | POA: Insufficient documentation

## 2019-05-04 DIAGNOSIS — F17210 Nicotine dependence, cigarettes, uncomplicated: Secondary | ICD-10-CM | POA: Insufficient documentation

## 2019-05-04 DIAGNOSIS — Z8619 Personal history of other infectious and parasitic diseases: Secondary | ICD-10-CM | POA: Insufficient documentation

## 2019-05-28 ENCOUNTER — Other Ambulatory Visit: Payer: Self-pay | Admitting: Family

## 2019-06-30 DIAGNOSIS — R69 Illness, unspecified: Secondary | ICD-10-CM | POA: Diagnosis not present

## 2019-07-29 DIAGNOSIS — N289 Disorder of kidney and ureter, unspecified: Secondary | ICD-10-CM | POA: Insufficient documentation

## 2019-07-29 DIAGNOSIS — Z6828 Body mass index (BMI) 28.0-28.9, adult: Secondary | ICD-10-CM | POA: Diagnosis not present

## 2019-07-29 DIAGNOSIS — Z01419 Encounter for gynecological examination (general) (routine) without abnormal findings: Secondary | ICD-10-CM | POA: Diagnosis not present

## 2019-07-29 DIAGNOSIS — Z1231 Encounter for screening mammogram for malignant neoplasm of breast: Secondary | ICD-10-CM | POA: Diagnosis not present

## 2019-08-10 ENCOUNTER — Ambulatory Visit: Payer: Medicare HMO | Attending: Internal Medicine

## 2019-08-10 DIAGNOSIS — Z23 Encounter for immunization: Secondary | ICD-10-CM | POA: Insufficient documentation

## 2019-08-10 NOTE — Progress Notes (Signed)
   Covid-19 Vaccination Clinic  Name:  EVALEIGH MCCAMY    MRN: [id number] DOB: 07-Jan-1951  08/10/2019  Ms. Jennings was observed post Covid-19 immunization for 15 minutes without incidence. She was provided with Vaccine Information Sheet and instruction to access the V-Safe system.   Ms. Raybon was instructed to call 911 with any severe reactions post vaccine: Marland Kitchen Difficulty breathing  . Swelling of your face and throat  . A fast heartbeat  . A bad rash all over your body  . Dizziness and weakness    Immunizations Administered    Name Date Dose VIS Date Route   Pfizer COVID-19 Vaccine 08/10/2019 12:23 PM 0.3 mL 05/22/2019 Intramuscular   Manufacturer: [employer], [employer]   Lot: [id number]   NDC: [address]-4

## 2019-08-17 ENCOUNTER — Other Ambulatory Visit: Payer: Self-pay | Admitting: Family

## 2019-08-17 DIAGNOSIS — K2100 Gastro-esophageal reflux disease with esophagitis, without bleeding: Secondary | ICD-10-CM

## 2019-08-19 DIAGNOSIS — Z0100 Encounter for examination of eyes and vision without abnormal findings: Secondary | ICD-10-CM | POA: Diagnosis not present

## 2019-08-26 ENCOUNTER — Other Ambulatory Visit: Payer: Self-pay

## 2019-08-27 ENCOUNTER — Ambulatory Visit (INDEPENDENT_AMBULATORY_CARE_PROVIDER_SITE_OTHER): Payer: Medicare HMO | Admitting: Family Medicine

## 2019-08-27 ENCOUNTER — Encounter: Payer: Self-pay | Admitting: Family Medicine

## 2019-08-27 VITALS — BP 138/82 | HR 84 | Temp 97.3°F | Wt 172.0 lb

## 2019-08-27 DIAGNOSIS — Z8619 Personal history of other infectious and parasitic diseases: Secondary | ICD-10-CM

## 2019-08-27 DIAGNOSIS — Z0000 Encounter for general adult medical examination without abnormal findings: Secondary | ICD-10-CM

## 2019-08-27 DIAGNOSIS — R7303 Prediabetes: Secondary | ICD-10-CM | POA: Diagnosis not present

## 2019-08-27 DIAGNOSIS — L821 Other seborrheic keratosis: Secondary | ICD-10-CM | POA: Diagnosis not present

## 2019-08-27 DIAGNOSIS — L309 Dermatitis, unspecified: Secondary | ICD-10-CM | POA: Diagnosis not present

## 2019-08-27 DIAGNOSIS — F32A Depression, unspecified: Secondary | ICD-10-CM

## 2019-08-27 DIAGNOSIS — Z23 Encounter for immunization: Secondary | ICD-10-CM

## 2019-08-27 DIAGNOSIS — E782 Mixed hyperlipidemia: Secondary | ICD-10-CM | POA: Diagnosis not present

## 2019-08-27 DIAGNOSIS — I10 Essential (primary) hypertension: Secondary | ICD-10-CM

## 2019-08-27 DIAGNOSIS — F329 Major depressive disorder, single episode, unspecified: Secondary | ICD-10-CM

## 2019-08-27 DIAGNOSIS — R69 Illness, unspecified: Secondary | ICD-10-CM | POA: Diagnosis not present

## 2019-08-27 DIAGNOSIS — F419 Anxiety disorder, unspecified: Secondary | ICD-10-CM

## 2019-08-27 LAB — COMPREHENSIVE METABOLIC PANEL
ALT: 19 U/L (ref 0–35)
AST: 26 U/L (ref 0–37)
Albumin: 4.3 g/dL (ref 3.5–5.2)
Alkaline Phosphatase: 62 U/L (ref 39–117)
BUN: 13 mg/dL (ref 6–23)
CO2: 28 mEq/L (ref 19–32)
Calcium: 9.6 mg/dL (ref 8.4–10.5)
Chloride: 106 mEq/L (ref 96–112)
Creatinine, Ser: 0.83 mg/dL (ref 0.40–1.20)
GFR: 82.85 mL/min (ref >60.00)
Glucose, Bld: 100 mg/dL — ABNORMAL HIGH (ref 70–99)
Potassium: 3.6 mEq/L (ref 3.5–5.1)
Sodium: 139 mEq/L (ref 135–145)
Total Bilirubin: 0.4 mg/dL (ref 0.2–1.2)
Total Protein: 7.8 g/dL (ref 6.0–8.3)

## 2019-08-27 LAB — LIPID PANEL
Cholesterol: 193 mg/dL (ref 0–200)
HDL: 62.9 mg/dL (ref >39.00)
LDL Cholesterol: 109 mg/dL — ABNORMAL HIGH (ref 0–99)
NonHDL: 130.55
Total CHOL/HDL Ratio: 3
Triglycerides: 110 mg/dL (ref 0.0–149.0)
VLDL: 22 mg/dL (ref 0.0–40.0)

## 2019-08-27 LAB — CBC
HCT: 40.7 % (ref 36.0–46.0)
Hemoglobin: 13.5 g/dL (ref 12.0–15.0)
MCHC: 33.2 g/dL (ref 30.0–36.0)
MCV: 92 fl (ref 78.0–100.0)
Platelets: 201 10*3/uL (ref 150.0–400.0)
RBC: 4.42 Mil/uL (ref 3.87–5.11)
RDW: 13.5 % (ref 11.5–15.5)
WBC: 8.2 10*3/uL (ref 4.0–10.5)

## 2019-08-27 LAB — HEMOGLOBIN A1C: Hgb A1c MFr Bld: 6.1 % (ref 4.6–6.5)

## 2019-08-27 MED ORDER — CLOBETASOL PROP EMOLLIENT BASE 0.05 % EX CREA: TOPICAL_CREAM | CUTANEOUS | 0 refills | Status: AC

## 2019-08-27 NOTE — Progress Notes (Signed)
Subjective:     Patricia Aguirre is a 69 y.o. female and is here for a comprehensive physical exam. The patient reports problems - pruritis, decreased mood, need for endoscopy.  Pt notes pruritis of back.  Denies jaundice, rash, changes in soaps, lotions, or detergents.  Pt has not seen her Gastroenterologist in a while to f/u on h/o Hep C.  Pt states she has is supposed to have an endoscopy soon.  Pt endorses feeling down since being stuck in the house 2/2 COVID-19 pandemic.  Taking bp meds.  Social History   Socioeconomic History  . Marital status: Single    Spouse name: Not on file  . Number of children: 1  . Years of education: 84  . Highest education level: Not on file  Occupational History  . Not on file  Tobacco Use  . Smoking status: Former Smoker    Packs/day: 0.25    Years: 20.00    Pack years: 5.00    Types: Cigarettes    Quit date: 05/25/2013    Years since quitting: 6.2  . Smokeless tobacco: Never Used  Substance and Sexual Activity  . Alcohol use: No    Alcohol/week: 0.0 standard drinks    Comment: last -occ   . Drug use: No  . Sexual activity: Never    Comment: stopped-  Other Topics Concern  . Not on file  Social History Narrative   Current smoker- 5/day   Alcohol use - Occasionally   Caffeine- coffee/tea   Marital- Single   House-1 story, lives alone. Pets-No   Highest level of education- [employer]   Current or past Programme researcher, broadcasting/film/video, Clinical biochemist   Exercise- NO   Fun: Not much fun right now.    Denies abuse and feels safe at home.    Social Determinants of Health   Financial Resource Strain:   . Difficulty of Paying Living Expenses:   Food Insecurity:   . Worried About Programme researcher, broadcasting/film/video in the Last Year:   . Barista in the Last Year:   Transportation Needs:   . Freight forwarder (Medical):   Marland Kitchen Lack of Transportation (Non-Medical):   Physical Activity:   . Days of Exercise per Week:   . Minutes of Exercise  per Session:   Stress:   . Feeling of Stress :   Social Connections:   . Frequency of Communication with Friends and Family:   . Frequency of Social Gatherings with Friends and Family:   . Attends Religious Services:   . Active Member of Clubs or Organizations:   . Attends Banker Meetings:   Marland Kitchen Marital Status:   Intimate Partner Violence:   . Fear of Current or Ex-Partner:   . Emotionally Abused:   Marland Kitchen Physically Abused:   . Sexually Abused:    Health Maintenance  Topic Date Due  . TETANUS/TDAP  Never done  . PNA vac Low Risk Adult (1 of 2 - PCV13) 02/26/2017  . INFLUENZA VACCINE  01/10/2019  . MAMMOGRAM  04/11/2020  . COLONOSCOPY  10/28/2023  . DEXA SCAN  Completed  . Hepatitis C Screening  Completed    The following portions of the patient's history were reviewed and updated as appropriate: allergies, current medications, past family history, past medical history, past social history, past surgical history and problem list.  Review of Systems Pertinent items noted in HPI and remainder of comprehensive ROS otherwise negative.   Objective:    BP  138/82 (BP Location: Left Arm, Patient Position: Sitting, Cuff Size: Large)   Pulse 84   Temp (!) 97.3 F (36.3 C) (Temporal)   Wt 172 lb (78 kg)   SpO2 98%   BMI 28.19 kg/m  General appearance: alert, cooperative and no distress, flat affect/depressed mood Head: Normocephalic, without obvious abnormality, atraumatic Eyes: conjunctivae/corneas clear. PERRL, EOM's intact. Fundi benign. Ears: normal TM's and external ear canals both ears Nose: Nares normal. Septum midline. Mucosa normal. No drainage or sinus tenderness. Throat: lips, mucosa, and tongue normal; teeth and gums normal Neck: no adenopathy, no carotid bruit, no JVD, supple, symmetrical, trachea midline and thyroid not enlarged, symmetric, no tenderness/mass/nodules Lungs: clear to auscultation bilaterally Heart: regular rate and rhythm, S1, S2 normal, no  murmur, click, rub or gallop Abdomen: Soft, nontender, nondistended. Liver enlarged 4.5 cm below costal margin Extremities: extremities normal, atraumatic, no cyanosis or edema Pulses: 2+ and symmetric Skin: Skin color, texture, turgor normal. No rashes on back, skin appears dry.  Several seborrheic keratosis lesions of L upper back/lateral thoracic area. Lymph nodes: Cervical, supraclavicular, and axillary nodes normal. Neurologic: Alert and oriented X 3, normal strength and tone. Normal symmetric reflexes. Normal coordination and gait    Assessment:    Healthy female exam with dermatitis, anxiety, and depression symptoms.     Plan:     Anticipatory guidance given including wearing seatbelts, smoke detectors in the home, increasing physical activity, increasing p.o. intake of water and vegetables. -will obtain labs -discussed mammogram and bone density scan. -pap done per pt 2019 with OB/Gyn -given handout -next CPE in 1 yr See After Visit Summary for Counseling Recommendations    History of hepatitis C -2/2 h/o hep C treated with Harvoni per GI note  -encouraged to follow-up with GI - Plan: CBC (no diff), Comprehensive metabolic panel  Essential hypertension  - Plan: Comprehensive metabolic panel  Mixed hyperlipidemia  - Plan: Lipid panel  Prediabetes  -Globin A1c 6.3% on 01/07/2018 -Discussed lifestyle modifications - Plan: Hemoglobin A1c  Dermatitis  - Plan: Clobetasol Prop Emollient Base 0.05 % emollient cream  Seborrheic keratosis -given handout  Anxiety and depression -PHQ-9 score 17 -GAD-7 score 12 -Discussed various treatment options including counseling and medications. -Pt declines at this time. -We will continue to monitor at each visit -We will have patient follow-up in 1 month, sooner if needed.  Need for Tdap vaccination  - Plan: Tdap vaccine greater than or equal to 7yo IM  Need for vaccination against Streptococcus pneumoniae using pneumococcal  conjugate vaccine 13  - Plan: Pneumococcal conjugate vaccine 13-valent  F/u in 1 month sooner if needed.  Grier Mitts, MD

## 2019-08-27 NOTE — Patient Instructions (Signed)
Seborrheic Keratosis A seborrheic keratosis is a common, noncancerous (benign) skin growth. These growths are velvety, waxy, rough, tan, brown, or black spots that appear on the skin. These skin growths can be flat or raised, and scaly. What are the causes? The cause of this condition is not known. What increases the risk? You are more likely to develop this condition if you:  Have a family history of seborrheic keratosis.  Are 50 or older.  Are pregnant.  Have had estrogen replacement therapy. What are the signs or symptoms? Symptoms of this condition include growths on the face, chest, shoulders, back, or other areas. These growths:  Are usually painless, but may become irritated and itchy.  Can be yellow, brown, black, or other colors.  Are slightly raised or have a flat surface.  Are sometimes rough or wart-like in texture.  Are often velvety or waxy on the surface.  Are round or oval-shaped.  Often occur in groups, but may occur as a single growth. How is this diagnosed? This condition is diagnosed with a medical history and physical exam.  A sample of the growth may be tested (skin biopsy).  You may need to see a skin specialist (dermatologist). How is this treated? Treatment is not usually needed for this condition, unless the growths are irritated or bleed often.  You may also choose to have the growths removed if you do not like their appearance. ? Most commonly, these growths are treated with a procedure in which liquid nitrogen is applied to "freeze" off the growth (cryosurgery). ? They may also be burned off with electricity (electrocautery) or removed by scraping (curettage). Follow these instructions at home:  Watch your growth for any changes.  Keep all follow-up visits as told by your health care provider. This is important.  Do not scratch or pick at the growth or growths. This can cause them to become irritated or infected. Contact a health care  provider if:  You suddenly have many new growths.  Your growth bleeds, itches, or hurts.  Your growth suddenly becomes larger or changes color. Summary  A seborrheic keratosis is a common, noncancerous (benign) skin growth.  Treatment is not usually needed for this condition, unless the growths are irritated or bleed often.  Watch your growth for any changes.  Contact a health care provider if you suddenly have many new growths or your growth suddenly becomes larger or changes color.  Keep all follow-up visits as told by your health care provider. This is important. This information is not intended to replace advice given to you by your health care provider. Make sure you discuss any questions you have with your health care provider. Document Revised: 10/10/2017 Document Reviewed: 10/10/2017 Elsevier Patient Education  [place].  Pruritus Pruritus is an itchy feeling on the skin. One of the most common causes is dry skin, but many different things can cause itching. Most cases of itching do not require medical attention. Sometimes itchy skin can turn into a rash. Follow these instructions at home: Skin care   Apply moisturizing lotion to your skin as needed. Lotion that contains petroleum jelly is best.  Take medicines or apply medicated creams only as told by your health care provider. This may include: ? Corticosteroid cream. ? Anti-itch lotions. ? Oral antihistamines.  Apply a cool, wet cloth (cool compress) to the affected areas.  Take baths with one of the following: ? Epsom salts. You can get these at your local pharmacy or grocery  store. Follow the instructions on the packaging. ? Baking soda. Pour a small amount into the bath as told by your health care provider. ? Colloidal oatmeal. You can get this at your local pharmacy or grocery store. Follow the instructions on the packaging.  Apply baking soda paste to your skin. To make the paste, stir water into a  small amount of baking soda until it reaches a paste-like consistency.  Do not scratch your skin.  Do not take hot showers or baths, which can make itching worse. A cool shower may help with itching as long as you apply moisturizing lotion after the shower.  Do not use scented soaps, detergents, perfumes, and cosmetic products. Instead, use gentle, unscented versions of these items. General instructions  Avoid wearing tight clothes.  Keep a journal to help find out what is causing your itching. Write down: ? What you eat and drink. ? What cosmetic products you use. ? What soaps or detergents you use. ? What you wear, including jewelry.  Use a humidifier. This keeps the air moist, which helps to prevent dry skin.  Be aware of any changes in your itchiness. Contact a health care provider if:  The itching does not go away after several days.  You are unusually thirsty or urinating more than normal.  Your skin tingles or feels numb.  Your skin or the white parts of your eyes turn yellow (jaundice).  You feel weak.  You have any of the following: ? Night sweats. ? Tiredness (fatigue). ? Weight loss. ? Abdominal pain. Summary  Pruritus is an itchy feeling on the skin. One of the most common causes is dry skin, but many different conditions and factors can cause itching.  Apply moisturizing lotion to your skin as needed. Lotion that contains petroleum jelly is best.  Take medicines or apply medicated creams only as told by your health care provider.  Do not take hot showers or baths. Do not use scented soaps, detergents, perfumes, or cosmetic products. This information is not intended to replace advice given to you by your health care provider. Make sure you discuss any questions you have with your health care provider. Document Revised: 06/11/2017 Document Reviewed: 06/11/2017 Elsevier Patient Education  2020 [employer].

## 2019-09-03 ENCOUNTER — Ambulatory Visit: Payer: Self-pay | Admitting: *Deleted

## 2019-09-03 ENCOUNTER — Telehealth: Payer: Self-pay | Admitting: *Deleted

## 2019-09-03 NOTE — Telephone Encounter (Signed)
Opened in error. Please disregard.

## 2019-09-03 NOTE — Telephone Encounter (Signed)
Patient calls after completing the [employer] pre-registration screening for her 2nd Covid vaccine appointment. She had other vaccines on 08/27/19. CDC recommends not taking the Covid vaccine within 14 days of other immunizations. Cancelled her 3/30 appointment at the [facility]. Will reschedule her for 09/10/19 at same location when the schedule for next week opens. Informed the customer I would reach out to her with the new appointment.

## 2019-09-08 ENCOUNTER — Ambulatory Visit: Payer: Medicare HMO

## 2019-09-12 ENCOUNTER — Ambulatory Visit: Payer: Medicare HMO | Attending: Internal Medicine

## 2019-09-12 DIAGNOSIS — Z23 Encounter for immunization: Secondary | ICD-10-CM

## 2019-09-12 NOTE — Progress Notes (Signed)
   Covid-19 Vaccination Clinic  Name:  TANNIE KOSKELA    MRN: [id number] DOB: 09/19/1950  09/12/2019  Ms. Gariepy was observed post Covid-19 immunization for 15 minutes without incident. She was provided with Vaccine Information Sheet and instruction to access the V-Safe system.   Ms. Petkus was instructed to call 911 with any severe reactions post vaccine: Marland Kitchen Difficulty breathing  . Swelling of face and throat  . A fast heartbeat  . A bad rash all over body  . Dizziness and weakness   Immunizations Administered    Name Date Dose VIS Date Route   Pfizer COVID-19 Vaccine 09/12/2019 11:45 AM 0.3 mL 05/22/2019 Intramuscular   Manufacturer: [employer], [employer]   Lot: [id number]   NDC: [address]-8

## 2019-10-29 ENCOUNTER — Ambulatory Visit: Payer: Medicare HMO | Admitting: Family Medicine

## 2019-11-28 ENCOUNTER — Other Ambulatory Visit: Payer: Self-pay | Admitting: Family

## 2019-11-28 DIAGNOSIS — K2100 Gastro-esophageal reflux disease with esophagitis, without bleeding: Secondary | ICD-10-CM

## 2019-12-22 DIAGNOSIS — H823 Vertiginous syndromes in diseases classified elsewhere, bilateral: Secondary | ICD-10-CM | POA: Diagnosis not present

## 2019-12-23 DIAGNOSIS — Z0100 Encounter for examination of eyes and vision without abnormal findings: Secondary | ICD-10-CM | POA: Diagnosis not present

## 2019-12-31 DIAGNOSIS — R69 Illness, unspecified: Secondary | ICD-10-CM | POA: Diagnosis not present

## 2020-01-14 ENCOUNTER — Telehealth: Payer: Self-pay | Admitting: Family Medicine

## 2020-01-14 NOTE — Telephone Encounter (Signed)
Left message for patient to schedule Annual Wellness Visit.  Please schedule with Nurse Health Advisor Shannon Crews, RN at [facility]  

## 2020-02-03 ENCOUNTER — Telehealth: Payer: Self-pay | Admitting: Family Medicine

## 2020-02-03 NOTE — Telephone Encounter (Signed)
Left message for patient to schedule Annual Wellness Visit.  Please schedule with Nurse Health Advisor Shannon Crews, RN at [facility]  

## 2020-04-05 DIAGNOSIS — R69 Illness, unspecified: Secondary | ICD-10-CM | POA: Diagnosis not present

## 2020-04-19 DIAGNOSIS — G5761 Lesion of plantar nerve, right lower limb: Secondary | ICD-10-CM | POA: Diagnosis not present

## 2020-04-19 DIAGNOSIS — G5762 Lesion of plantar nerve, left lower limb: Secondary | ICD-10-CM | POA: Diagnosis not present

## 2020-04-19 DIAGNOSIS — G629 Polyneuropathy, unspecified: Secondary | ICD-10-CM | POA: Diagnosis not present

## 2020-05-11 DIAGNOSIS — R69 Illness, unspecified: Secondary | ICD-10-CM | POA: Diagnosis not present

## 2020-05-24 ENCOUNTER — Other Ambulatory Visit: Payer: Self-pay | Admitting: Family

## 2020-05-24 DIAGNOSIS — K2100 Gastro-esophageal reflux disease with esophagitis, without bleeding: Secondary | ICD-10-CM

## 2020-07-26 ENCOUNTER — Other Ambulatory Visit: Payer: Self-pay | Admitting: Family

## 2020-07-26 ENCOUNTER — Telehealth: Payer: Self-pay | Admitting: Family Medicine

## 2020-07-26 ENCOUNTER — Other Ambulatory Visit: Payer: Self-pay

## 2020-07-26 MED ORDER — LOSARTAN POTASSIUM 100 MG PO TABS: 100.0000 mg | ORAL_TABLET | Freq: Every day | ORAL | 0 refills | Status: DC

## 2020-07-26 MED ORDER — HYDROCHLOROTHIAZIDE 25 MG PO TABS: 25.0000 mg | ORAL_TABLET | Freq: Every day | ORAL | 0 refills | Status: DC

## 2020-07-26 NOTE — Telephone Encounter (Signed)
Pt call and want a refill on losartan (COZAAR) 100 MG tablet and  hydrochlorothiazide (HYDRODIURIL) 25 MG tablet sent to  [id number] - [place], [place] - [address] AT [facility] [address] Phone:  [phone]  Fax:  [phone]

## 2020-07-26 NOTE — Telephone Encounter (Signed)
Pt Rx sent to pharmacy for refill

## 2020-08-17 ENCOUNTER — Other Ambulatory Visit: Payer: Self-pay | Admitting: Family Medicine

## 2020-08-17 ENCOUNTER — Other Ambulatory Visit: Payer: Self-pay | Admitting: Family

## 2020-08-26 ENCOUNTER — Other Ambulatory Visit: Payer: Self-pay

## 2020-08-29 ENCOUNTER — Other Ambulatory Visit: Payer: Self-pay

## 2020-08-29 ENCOUNTER — Encounter: Payer: Self-pay | Admitting: Family Medicine

## 2020-08-29 ENCOUNTER — Ambulatory Visit (INDEPENDENT_AMBULATORY_CARE_PROVIDER_SITE_OTHER): Payer: Medicare HMO | Admitting: Family Medicine

## 2020-08-29 VITALS — BP 124/78 | HR 62 | Temp 98.1°F | Ht 65.0 in | Wt 172.0 lb

## 2020-08-29 DIAGNOSIS — R7303 Prediabetes: Secondary | ICD-10-CM | POA: Diagnosis not present

## 2020-08-29 DIAGNOSIS — F419 Anxiety disorder, unspecified: Secondary | ICD-10-CM | POA: Diagnosis not present

## 2020-08-29 DIAGNOSIS — H6122 Impacted cerumen, left ear: Secondary | ICD-10-CM

## 2020-08-29 DIAGNOSIS — Z0001 Encounter for general adult medical examination with abnormal findings: Secondary | ICD-10-CM

## 2020-08-29 DIAGNOSIS — F321 Major depressive disorder, single episode, moderate: Secondary | ICD-10-CM | POA: Diagnosis not present

## 2020-08-29 DIAGNOSIS — R69 Illness, unspecified: Secondary | ICD-10-CM | POA: Diagnosis not present

## 2020-08-29 DIAGNOSIS — E782 Mixed hyperlipidemia: Secondary | ICD-10-CM

## 2020-08-29 LAB — CBC WITH DIFFERENTIAL/PLATELET
Basophils Absolute: 0 10*3/uL (ref 0.0–0.1)
Basophils Relative: 0.5 % (ref 0.0–3.0)
Eosinophils Absolute: 0.2 10*3/uL (ref 0.0–0.7)
Eosinophils Relative: 2.1 % (ref 0.0–5.0)
HCT: 41.9 % (ref 36.0–46.0)
Hemoglobin: 14.1 g/dL (ref 12.0–15.0)
Lymphocytes Relative: 43.8 % (ref 12.0–46.0)
Lymphs Abs: 3.4 10*3/uL (ref 0.7–4.0)
MCHC: 33.6 g/dL (ref 30.0–36.0)
MCV: 91.8 fl (ref 78.0–100.0)
Monocytes Absolute: 0.6 10*3/uL (ref 0.1–1.0)
Monocytes Relative: 8.1 % (ref 3.0–12.0)
Neutro Abs: 3.6 10*3/uL (ref 1.4–7.7)
Neutrophils Relative %: 45.5 % (ref 43.0–77.0)
Platelets: 213 10*3/uL (ref 150.0–400.0)
RBC: 4.56 Mil/uL (ref 3.87–5.11)
RDW: 13.4 % (ref 11.5–15.5)
WBC: 7.8 10*3/uL (ref 4.0–10.5)

## 2020-08-29 LAB — COMPREHENSIVE METABOLIC PANEL
ALT: 20 U/L (ref 0–35)
AST: 24 U/L (ref 0–37)
Albumin: 4.6 g/dL (ref 3.5–5.2)
Alkaline Phosphatase: 52 U/L (ref 39–117)
BUN: 12 mg/dL (ref 6–23)
CO2: 29 mEq/L (ref 19–32)
Calcium: 9.9 mg/dL (ref 8.4–10.5)
Chloride: 103 mEq/L (ref 96–112)
Creatinine, Ser: 0.84 mg/dL (ref 0.40–1.20)
GFR: 71.36 mL/min (ref >60.00)
Glucose, Bld: 97 mg/dL (ref 70–99)
Potassium: 3.6 mEq/L (ref 3.5–5.1)
Sodium: 139 mEq/L (ref 135–145)
Total Bilirubin: 0.6 mg/dL (ref 0.2–1.2)
Total Protein: 7.8 g/dL (ref 6.0–8.3)

## 2020-08-29 LAB — LIPID PANEL
Cholesterol: 200 mg/dL (ref 0–200)
HDL: 69.6 mg/dL (ref >39.00)
LDL Cholesterol: 100 mg/dL — ABNORMAL HIGH (ref 0–99)
NonHDL: 130.79
Total CHOL/HDL Ratio: 3
Triglycerides: 153 mg/dL — ABNORMAL HIGH (ref 0.0–149.0)
VLDL: 30.6 mg/dL (ref 0.0–40.0)

## 2020-08-29 LAB — VITAMIN D 25 HYDROXY (VIT D DEFICIENCY, FRACTURES): VITD: 46.98 ng/mL (ref 30.00–100.00)

## 2020-08-29 LAB — TSH: TSH: 1.73 u[IU]/mL (ref 0.35–4.50)

## 2020-08-29 LAB — HEMOGLOBIN A1C: Hgb A1c MFr Bld: 6.3 % (ref 4.6–6.5)

## 2020-08-29 LAB — T4, FREE: Free T4: 0.89 ng/dL (ref 0.60–1.60)

## 2020-08-29 LAB — VITAMIN B12: Vitamin B-12: 496 pg/mL (ref 211–911)

## 2020-08-29 NOTE — Patient Instructions (Addendum)
You can use Debrox eardrops which can be located over-the-counter at your local drugstore.  Preventive Care 69 Years and Older, Female Preventive care refers to lifestyle choices and visits with your health care provider that can promote health and wellness. This includes:  A yearly physical exam. This is also called an annual wellness visit.  Regular dental and eye exams.  Immunizations.  Screening for certain conditions.  Healthy lifestyle choices, such as: ? Eating a healthy diet. ? Getting regular exercise. ? Not using drugs or products that contain nicotine and tobacco. ? Limiting alcohol use. What can I expect for my preventive care visit? Physical exam Your health care provider will check your:  Height and weight. These may be used to calculate your BMI (body mass index). BMI is a measurement that tells if you are at a healthy weight.  Heart rate and blood pressure.  Body temperature.  Skin for abnormal spots. Counseling Your health care provider may ask you questions about your:  Past medical problems.  Family's medical history.  Alcohol, tobacco, and drug use.  Emotional well-being.  Home life and relationship well-being.  Sexual activity.  Diet, exercise, and sleep habits.  History of falls.  Memory and ability to understand (cognition).  Work and work Statistician.  Pregnancy and menstrual history.  Access to firearms. What immunizations do I need? Vaccines are usually given at various ages, according to a schedule. Your health care provider will recommend vaccines for you based on your age, medical history, and lifestyle or other factors, such as travel or where you work.   What tests do I need? Blood tests  Lipid and cholesterol levels. These may be checked every 5 years, or more often depending on your overall health.  Hepatitis C test.  Hepatitis B test. Screening  Lung cancer screening. You may have this screening every year starting  at age 69 if you have a 30-pack-year history of smoking and currently smoke or have quit within the past 15 years.  Colorectal cancer screening. ? All adults should have this screening starting at age 69 and continuing until age 58. ? Your health care provider may recommend screening at age 69 if you are at increased risk. ? You will have tests every 1-10 years, depending on your results and the type of screening test.  Diabetes screening. ? This is done by checking your blood sugar (glucose) after you have not eaten for a while (fasting). ? You may have this done every 1-3 years.  Mammogram. ? This may be done every 1-2 years. ? Talk with your health care provider about how often you should have regular mammograms.  Abdominal aortic aneurysm (AAA) screening. You may need this if you are a current or former smoker.  BRCA-related cancer screening. This may be done if you have a family history of breast, ovarian, tubal, or peritoneal cancers. Other tests  STD (sexually transmitted disease) testing, if you are at risk.  Bone density scan. This is done to screen for osteoporosis. You may have this done starting at age 69. Talk with your health care provider about your test results, treatment options, and if necessary, the need for more tests. Follow these instructions at home: Eating and drinking  Eat a diet that includes fresh fruits and vegetables, whole grains, lean protein, and low-fat dairy products. Limit your intake of foods with high amounts of sugar, saturated fats, and salt.  Take vitamin and mineral supplements as recommended by your health care provider.  Do not drink alcohol if your health care provider tells you not to drink.  If you drink alcohol: ? Limit how much you have to 0-1 drink a day. ? Be aware of how much alcohol is in your drink. In the [place] one drink equals one 12 oz bottle of beer (355 mL), one 5 oz glass of wine (148 mL), or one 1 oz glass of hard liquor  (44 mL).   Lifestyle  Take daily care of your teeth and gums. Brush your teeth every morning and night with fluoride toothpaste. Floss one time each day.  Stay active. Exercise for at least 30 minutes 5 or more days each week.  Do not use any products that contain nicotine or tobacco, such as cigarettes, e-cigarettes, and chewing tobacco. If you need help quitting, ask your health care provider.  Do not use drugs.  If you are sexually active, practice safe sex. Use a condom or other form of protection in order to prevent STIs (sexually transmitted infections).  Talk with your health care provider about taking a low-dose aspirin or statin.  Find healthy ways to cope with stress, such as: ? Meditation, yoga, or listening to music. ? Journaling. ? Talking to a trusted person. ? Spending time with friends and family. Safety  Always wear your seat belt while driving or riding in a vehicle.  Do not drive: ? If you have been drinking alcohol. Do not ride with someone who has been drinking. ? When you are tired or distracted. ? While texting.  Wear a helmet and other protective equipment during sports activities.  If you have firearms in your house, make sure you follow all gun safety procedures. What's next?  Visit your health care provider once a year for an annual wellness visit.  Ask your health care provider how often you should have your eyes and teeth checked.  Stay up to date on all vaccines. This information is not intended to replace advice given to you by your health care provider. Make sure you discuss any questions you have with your health care provider. Document Revised: 05/18/2020 Document Reviewed: 05/22/2018 Elsevier Patient Education  2021 [place], Adult The ears produce a substance called earwax that helps keep bacteria out of the ear and protects the skin in the ear canal. Occasionally, earwax can build up in the ear and cause discomfort  or hearing loss. What are the causes? This condition is caused by a buildup of earwax. Ear canals are self-cleaning. Ear wax is made in the outer part of the ear canal and generally falls out in small amounts over time. When the self-cleaning mechanism is not working, earwax builds up and can cause decreased hearing and discomfort. Attempting to clean ears with cotton swabs can push the earwax deep into the ear canal and cause decreased hearing and pain. What increases the risk? This condition is more likely to develop in people who:  Clean their ears often with cotton swabs.  Pick at their ears.  Use earplugs or in-ear headphones often, or wear hearing aids. The following factors may also make you more likely to develop this condition:  Being female.  Being of older age.  Naturally producing more earwax.  Having narrow ear canals.  Having earwax that is overly thick or sticky.  Having excess hair in the ear canal.  Having eczema.  Being dehydrated. What are the signs or symptoms? Symptoms of this condition include:  Reduced or muffled hearing.  A feeling of fullness in the ear or feeling that the ear is plugged.  Fluid coming from the ear.  Ear pain or an itchy ear.  Ringing in the ear.  Coughing.  Balance problems.  An obvious piece of earwax that can be seen inside the ear canal. How is this diagnosed? This condition may be diagnosed based on:  Your symptoms.  Your medical history.  An ear exam. During the exam, your health care provider will look into your ear with an instrument called an otoscope. You may have tests, including a hearing test. How is this treated? This condition may be treated by:  Using ear drops to soften the earwax.  Having the earwax removed by a health care provider. The health care provider may: ? Flush the ear with water. ? Use an instrument that has a loop on the end (curette). ? Use a suction device.  Having surgery to  remove the wax buildup. This may be done in severe cases. Follow these instructions at home:  Take over-the-counter and prescription medicines only as told by your health care provider.  Do not put any objects, including cotton swabs, into your ear. You can clean the opening of your ear canal with a washcloth or facial tissue.  Follow instructions from your health care provider about cleaning your ears. Do not overclean your ears.  Drink enough fluid to keep your urine pale yellow. This will help to thin the earwax.  Keep all follow-up visits as told. If earwax builds up in your ears often or if you use hearing aids, consider seeing your health care provider for routine, preventive ear cleanings. Ask your health care provider how often you should schedule your cleanings.  If you have hearing aids, clean them according to instructions from the manufacturer and your health care provider.   Contact a health care provider if:  You have ear pain.  You develop a fever.  You have pus or other fluid coming from your ear.  You have hearing loss.  You have ringing in your ears that does not go away.  You feel like the room is spinning (vertigo).  Your symptoms do not improve with treatment. Get help right away if:  You have bleeding from the affected ear.  You have severe ear pain. Summary  Earwax can build up in the ear and cause discomfort or hearing loss.  The most common symptoms of this condition include reduced or muffled hearing, a feeling of fullness in the ear, or feeling that the ear is plugged.  This condition may be diagnosed based on your symptoms, your medical history, and an ear exam.  This condition may be treated by using ear drops to soften the earwax or by having the earwax removed by a health care provider.  Do not put any objects, including cotton swabs, into your ear. You can clean the opening of your ear canal with a washcloth or facial tissue. This  information is not intended to replace advice given to you by your health care provider. Make sure you discuss any questions you have with your health care provider. Document Revised: 09/15/2019 Document Reviewed: 09/15/2019 Elsevier Patient Education  2021 [place].  Dyslipidemia Dyslipidemia is an imbalance of waxy, fat-like substances (lipids) in the blood. The body needs lipids in small amounts. Dyslipidemia often involves a high level of cholesterol or triglycerides, which are types of lipids. Common forms of dyslipidemia include:  High levels of LDL cholesterol. LDL is the  type of cholesterol that causes fatty deposits (plaques) to build up in the blood vessels that carry blood away from your heart (arteries).  Low levels of HDL cholesterol. HDL cholesterol is the type of cholesterol that protects against heart disease. High levels of HDL remove the LDL buildup from arteries.  High levels of triglycerides. Triglycerides are a fatty substance in the blood that is linked to a buildup of plaques in the arteries. What are the causes? Primary dyslipidemia is caused by changes (mutations) in genes that are passed down through families (inherited). These mutations cause several types of dyslipidemia. Secondary dyslipidemia is caused by lifestyle choices and diseases that lead to dyslipidemia, such as:  Eating a diet that is high in animal fat.  Not getting enough exercise.  Having diabetes, kidney disease, liver disease, or thyroid disease.  Drinking large amounts of alcohol.  Using certain medicines. What increases the risk? You are more likely to develop this condition if you are an older man or if you are a woman who has gone through menopause. Other risk factors include:  Having a family history of dyslipidemia.  Taking certain medicines, including birth control pills, steroids, some diuretics, and beta-blockers.  Smoking cigarettes.  Eating a high-fat diet.  Having  certain medical conditions such as diabetes, polycystic ovary syndrome (PCOS), kidney disease, liver disease, or hypothyroidism.  Not exercising regularly.  Being overweight or obese with too much belly fat. What are the signs or symptoms? In most cases, dyslipidemia does not usually cause any symptoms. In severe cases, very high lipid levels can cause:  Fatty bumps under the skin (xanthomas).  White or gray ring around the black center (pupil) of the eye. Very high triglyceride levels can cause inflammation of the pancreas (pancreatitis). How is this diagnosed? Your health care provider may diagnose dyslipidemia based on a routine blood test (fasting blood test). Because most people do not have symptoms of the condition, this blood testing (lipid profile) is done on adults age 55 and older and is repeated every 5 years. This test checks:  Total cholesterol. This measures the total amount of cholesterol in your blood, including LDL cholesterol, HDL cholesterol, and triglycerides. A healthy number is below 200.  LDL cholesterol. The target number for LDL cholesterol is different for each person, depending on individual risk factors. Ask your health care provider what your LDL cholesterol should be.  HDL cholesterol. An HDL level of 60 or higher is best because it helps to protect against heart disease. A number below 63 for men or below 39 for women increases the risk for heart disease.  Triglycerides. A healthy triglyceride number is below 150. If your lipid profile is abnormal, your health care provider may do other blood tests.   How is this treated? Treatment depends on the type of dyslipidemia that you have and your other risk factors for heart disease and stroke. Your health care provider will have a target range for your lipid levels based on this information. For many people, this condition may be treated by lifestyle changes, such as diet and exercise. Your health care provider may  recommend that you:  Get regular exercise.  Make changes to your diet.  Quit smoking if you smoke. If diet changes and exercise do not help you reach your goals, your health care provider may also prescribe medicine to lower lipids. The most commonly prescribed type of medicine lowers your LDL cholesterol (statin drug). If you have a high triglyceride level, your provider  may prescribe another type of drug (fibrate) or an omega-3 fish oil supplement, or both. Follow these instructions at home: Eating and drinking  Follow instructions from your health care provider or dietitian about eating or drinking restrictions.  Eat a healthy diet as told by your health care provider. This can help you reach and maintain a healthy weight, lower your LDL cholesterol, and raise your HDL cholesterol. This may include: ? Limiting your calories, if you are overweight. ? Eating more fruits, vegetables, whole grains, fish, and lean meats. ? Limiting saturated fat, trans fat, and cholesterol.  If you drink alcohol: ? Limit how much you use. ? Be aware of how much alcohol is in your drink. In the [place] one drink equals one 12 oz bottle of beer (355 mL), one 5 oz glass of wine (148 mL), or one 1 oz glass of hard liquor (44 mL).  Do not drink alcohol if: ? Your health care provider tells you not to drink. ? You are pregnant, may be pregnant, or are planning to become pregnant. Activity  Get regular exercise. Start an exercise and strength training program as told by your health care provider. Ask your health care provider what activities are safe for you. Your health care provider may recommend: ? 30 minutes of aerobic activity 4-6 days a week. Brisk walking is an example of aerobic activity. ? Strength training 2 days a week. General instructions  Do not use any products that contain nicotine or tobacco, such as cigarettes, e-cigarettes, and chewing tobacco. If you need help quitting, ask your health care  provider.  Take over-the-counter and prescription medicines only as told by your health care provider. This includes supplements.  Keep all follow-up visits as told by your health care provider.   Contact a health care provider if:  You are: ? Having trouble sticking to your exercise or diet plan. ? Struggling to quit smoking or control your use of alcohol. Summary  Dyslipidemia often involves a high level of cholesterol or triglycerides, which are types of lipids.  Treatment depends on the type of dyslipidemia that you have and your other risk factors for heart disease and stroke.  For many people, treatment starts with lifestyle changes, such as diet and exercise.  Your health care provider may prescribe medicine to lower lipids. This information is not intended to replace advice given to you by your health care provider. Make sure you discuss any questions you have with your health care provider. Document Revised: 01/20/2018 Document Reviewed: 12/27/2017 Elsevier Patient Education  [place].  http://APA.org/depression-guideline"> https://clinicalkey.com"> http://point-of-care.elsevierperformancemanager.com/skills/"> http://point-of-care.elsevierperformancemanager.com">  Managing Depression, Adult Depression is a mental health condition that affects your thoughts, feelings, and actions. Being diagnosed with depression can bring you relief if you did not know why you have felt or behaved a certain way. It could also leave you feeling overwhelmed with uncertainty about your future. Preparing yourself to manage your symptoms can help you feel more positive about your future. How to manage lifestyle changes Managing stress Stress is your body's reaction to life changes and events, both good and bad. Stress can add to your feelings of depression. Learning to manage your stress can help lessen your feelings of depression. Try some of the following approaches to reducing your stress  (stress reduction techniques):  Listen to music that you enjoy and that inspires you.  Try using a meditation app or take a meditation class.  Develop a practice that helps you connect with your spiritual self.  Walk in nature, pray, or go to a place of worship.  Do some deep breathing. To do this, inhale slowly through your nose. Pause at the top of your inhale for a few seconds and then exhale slowly, letting your muscles relax.  Practice yoga to help relax and work your muscles. Choose a stress reduction technique that suits your lifestyle and personality. These techniques take time and practice to develop. Set aside 5-15 minutes a day to do them. Therapists can offer training in these techniques. Other things you can do to manage stress include:  Keeping a stress diary.  Knowing your limits and saying no when you think something is too much.  Paying attention to how you react to certain situations. You may not be able to control everything, but you can change your reaction.  Adding humor to your life by watching funny films or TV shows.  Making time for activities that you enjoy and that relax you.   Medicines Medicines, such as antidepressants, are often a part of treatment for depression.  Talk with your pharmacist or health care provider about all the medicines, supplements, and herbal products that you take, their possible side effects, and what medicines and other products are safe to take together.  Make sure to report any side effects you may have to your health care provider. Relationships Your health care provider may suggest family therapy, couples therapy, or individual therapy as part of your treatment. How to recognize changes Everyone responds differently to treatment for depression. As you recover from depression, you may start to:  Have more interest in doing activities.  Feel less hopeless.  Have more energy.  Overeat less often, or have a better  appetite.  Have better mental focus. It is important to recognize if your depression is not getting better or is getting worse. The symptoms you had in the beginning may return, such as:  Tiredness (fatigue) or low energy.  Eating too much or too little.  Sleeping too much or too little.  Feeling restless, agitated, or hopeless.  Trouble focusing or making decisions.  Unexplained physical complaints.  Feeling irritable, angry, or aggressive. If you or your family members notice these symptoms coming back, let your health care provider know right away. Follow these instructions at home: Activity  Try to get some form of exercise each day, such as walking, biking, swimming, or lifting weights.  Practice stress reduction techniques.  Engage your mind by taking a class or doing some volunteer work.   Lifestyle  Get the right amount and quality of sleep.  Cut down on using caffeine, tobacco, alcohol, and other potentially harmful substances.  Eat a healthy diet that includes plenty of vegetables, fruits, whole grains, low-fat dairy products, and lean protein. Do not eat a lot of foods that are high in solid fats, added sugars, or salt (sodium). General instructions  Take over-the-counter and prescription medicines only as told by your health care provider.  Keep all follow-up visits as told by your health care provider. This is important. Where to find support Talking to others Friends and family members can be sources of support and guidance. Talk to trusted friends or family members about your condition. Explain your symptoms to them, and let them know that you are working with a health care provider to treat your depression. Tell friends and family members how they also can be helpful.   Finances  Find appropriate mental health providers that fit with your financial situation.  Talk with your health care provider about options to get reduced prices on your  medicines. Where to find more information You can find support in your area from:  Anxiety and Depression Association of America (ADAA): www.adaa.org  Mental Health America: www.mentalhealthamerica.net  [employer] on Mental Illness: www.nami.org Contact a health care provider if:  You stop taking your antidepressant medicines, and you have any of these symptoms: ? Nausea. ? Headache. ? Light-headedness. ? Chills and body aches. ? Not being able to sleep (insomnia).  You or your friends and family think your depression is getting worse. Get help right away if:  You have thoughts of hurting yourself or others. If you ever feel like you may hurt yourself or others, or have thoughts about taking your own life, get help right away. Go to your nearest emergency department or:  Call your local emergency services (911 in the [place]  Call a suicide crisis helpline, such as the [facility] at 6266516463. This is open 24 hours a day in the [place]  Text the Crisis Text Line at 581-180-6577 (in the [place].). Summary  If you are diagnosed with depression, preparing yourself to manage your symptoms is a good way to feel positive about your future.  Work with your health care provider on a management plan that includes stress reduction techniques, medicines (if applicable), therapy, and healthy lifestyle habits.  Keep talking with your health care provider about how your treatment is working.  If you have thoughts about taking your own life, call a suicide crisis helpline or text a crisis text line. This information is not intended to replace advice given to you by your health care provider. Make sure you discuss any questions you have with your health care provider. Document Revised: 04/08/2019 Document Reviewed: 04/08/2019 Elsevier Patient Education  2021 [employer].

## 2020-08-29 NOTE — Progress Notes (Signed)
Subjective:     Patricia Aguirre is a 70 y.o. female and is here for a comprehensive physical exam. The patient reports increased stress/morning about finances and personal life, but states "that is nothing a doctor can help me with".  Travel to [place] which she enjoyed.  Likes to sew but has not been in the mood.  Patient has not scheduled a mammogram.  Social History   Socioeconomic History  . Marital status: Single    Spouse name: Not on file  . Number of children: 1  . Years of education: 104  . Highest education level: Not on file  Occupational History  . Not on file  Tobacco Use  . Smoking status: Former Smoker    Packs/day: 0.25    Years: 20.00    Pack years: 5.00    Types: Cigarettes    Quit date: 05/25/2013    Years since quitting: 7.2  . Smokeless tobacco: Never Used  Substance and Sexual Activity  . Alcohol use: No    Alcohol/week: 0.0 standard drinks    Comment: last -occ   . Drug use: No  . Sexual activity: Never    Comment: stopped-  Other Topics Concern  . Not on file  Social History Narrative   Current smoker- 5/day   Alcohol use - Occasionally   Caffeine- coffee/tea   Marital- Single   House-1 story, lives alone. Pets-No   Highest level of education- [employer]   Current or past Programme researcher, broadcasting/film/video, Clinical biochemist   Exercise- NO   Fun: Not much fun right now.    Denies abuse and feels safe at home.    Social Determinants of Health   Financial Resource Strain: Not on file  Food Insecurity: Not on file  Transportation Needs: Not on file  Physical Activity: Not on file  Stress: Not on file  Social Connections: Not on file  Intimate Partner Violence: Not on file   Health Maintenance  Topic Date Due  . MAMMOGRAM  04/11/2020  . PNA vac Low Risk Adult (2 of 2 - PPSV23) 08/26/2020  . COVID-19 Vaccine (3 - Pfizer risk 4-dose series) 09/14/2020 (Originally 10/10/2019)  . INFLUENZA VACCINE  10/15/2020 (Originally 01/10/2020)  .  COLONOSCOPY (Pts 45-4yrs Insurance coverage will need to be confirmed)  10/28/2023  . TETANUS/TDAP  08/26/2029  . DEXA SCAN  Completed  . Hepatitis C Screening  Completed  . HPV VACCINES  Aged Out    The following portions of the patient's history were reviewed and updated as appropriate: allergies, current medications, past family history, past medical history, past social history, past surgical history and problem list.  Review of Systems Pertinent items noted in HPI and remainder of comprehensive ROS otherwise negative.   Objective:    BP 124/78 (BP Location: Right Arm, Patient Position: Sitting, Cuff Size: Normal)   Pulse 62   Temp 98.1 F (36.7 C) (Oral)   Ht 5\' 5"  (1.651 m)   Wt 172 lb (78 kg)   SpO2 98%   BMI 28.62 kg/m  General appearance: alert, cooperative and no distress Head: Normocephalic, without obvious abnormality, atraumatic Eyes: conjunctivae/corneas clear. PERRL, EOM's intact. Fundi benign. Ears: normal external ears b/l, R canal and Right TM.  Left canal occluded with cerumen.  Impacted cerumen remaining after irrigation. Nose: Nares normal. Septum midline. Mucosa normal. No drainage or sinus tenderness. Throat: lips, mucosa, and tongue normal; teeth and gums normal Neck: no adenopathy, no carotid bruit, no JVD, supple, symmetrical, trachea midline  and thyroid not enlarged, symmetric, no tenderness/mass/nodules Lungs: clear to auscultation bilaterally Heart: regular rate and rhythm, S1, S2 normal, no murmur, click, rub or gallop Abdomen: soft, non-tender; bowel sounds normal; no masses,  no organomegaly Extremities: extremities normal, atraumatic, no cyanosis or edema Pulses: 2+ and symmetric Skin: Skin color, texture, turgor normal. No rashes or lesions Lymph nodes: Cervical, supraclavicular, and axillary nodes normal. Neurologic: Alert and oriented X 3, normal strength and tone. Normal symmetric reflexes. Normal coordination and gait    Assessment:     Healthy female exam with depression.     Plan:     Anticipatory guidance given including wearing seatbelts, smoke detectors in the home, increasing physical activity, increasing p.o. intake of water and vegetables. -We will obtain labs -Patient encouraged to schedule mammogram -Colonoscopy up-to-date done 10/27/2013 -Given handout -Next CPE in 1 year See After Visit Summary for Counseling Recommendations    Depression, major, single episode, moderate (HCC) -PHQ-9 score 29 -GAD-7 score 11 -Patient encouraged to consider counseling and medication options -We will continue to monitor - Plan: TSH, T4, Free, Vitamin D, 25-hydroxy  Impacted cerumen of left ear -Consent obtained.  Left ear irrigated.  Patient tolerated procedure well.  Impacted cerumen remains. -Patient encouraged to obtain Debrox eardrops OTC -Given handout  Mixed hyperlipidemia -Lifestyle modifications - Plan: Comprehensive metabolic panel, Lipid panel  Prediabetes -Lifestyle modifications encouraged -Hemoglobin A1c 6.1% on 08/27/2019 - Plan: Hemoglobin A1c  Anxiety -GAD-7 score 11 -Counseling encouraged.  Also discussed medication options.  Patient declines at this time. -Continue to monitor -Plan: TSH, T4, free  Follow-up as needed  Abbe Amsterdam, MD

## 2020-09-02 ENCOUNTER — Telehealth: Payer: Self-pay | Admitting: Family Medicine

## 2020-09-02 NOTE — Telephone Encounter (Signed)
Patient is calling and stated that she is returning the call for lab results, please advise. CB is [phone]

## 2020-09-05 NOTE — Telephone Encounter (Signed)
Spoke with patient, is aware of results. Asked for a copy to be mailed to her. 

## 2020-09-05 NOTE — Progress Notes (Signed)
Spoke with patient, she is aware of results. Patient asked for results to be mailed to her address.

## 2020-09-06 NOTE — Progress Notes (Signed)
Spoke with patient, is aware of results. Asked for a copy to be mailed to her.

## 2020-09-15 ENCOUNTER — Other Ambulatory Visit: Payer: Self-pay | Admitting: Family

## 2020-09-15 DIAGNOSIS — K2100 Gastro-esophageal reflux disease with esophagitis, without bleeding: Secondary | ICD-10-CM

## 2020-09-23 ENCOUNTER — Other Ambulatory Visit: Payer: Self-pay | Admitting: Family

## 2020-09-23 DIAGNOSIS — K2100 Gastro-esophageal reflux disease with esophagitis, without bleeding: Secondary | ICD-10-CM

## 2020-09-26 DIAGNOSIS — Z6829 Body mass index (BMI) 29.0-29.9, adult: Secondary | ICD-10-CM | POA: Diagnosis not present

## 2020-09-26 DIAGNOSIS — Z124 Encounter for screening for malignant neoplasm of cervix: Secondary | ICD-10-CM | POA: Diagnosis not present

## 2020-09-26 DIAGNOSIS — Z1231 Encounter for screening mammogram for malignant neoplasm of breast: Secondary | ICD-10-CM | POA: Diagnosis not present

## 2020-10-08 DIAGNOSIS — Z20822 Contact with and (suspected) exposure to covid-19: Secondary | ICD-10-CM | POA: Diagnosis not present

## 2020-10-17 ENCOUNTER — Other Ambulatory Visit: Payer: Self-pay | Admitting: Family Medicine

## 2020-10-20 ENCOUNTER — Telehealth: Payer: Self-pay | Admitting: Family Medicine

## 2020-10-20 NOTE — Progress Notes (Signed)
  Chronic Care Management   Note  10/20/2020 Name: Patricia Aguirre MRN: [id number] DOB: 03-24-1951  Patricia Aguirre is a 70 y.o. year old female who is a primary care patient of Deeann Saint, MD. I reached out to Gardiner Ramus by phone today in response to a referral sent by Ms. Huey Romans PCP, Deeann Saint, MD.   Ms. Vandevander was given information about Chronic Care Management services today including:  1. CCM service includes personalized support from designated clinical staff supervised by her physician, including individualized plan of care and coordination with other care providers 2. 24/7 contact phone numbers for assistance for urgent and routine care needs. 3. Service will only be billed when office clinical staff spend 20 minutes or more in a month to coordinate care. 4. Only one practitioner may furnish and bill the service in a calendar month. 5. The patient may stop CCM services at any time (effective at the end of the month) by phone call to the office staff.   Patient agreed to services and verbal consent obtained.   Follow up plan:   Carley Perdue [employer] Scheduler

## 2020-12-01 ENCOUNTER — Telehealth: Payer: Self-pay | Admitting: Pharmacist

## 2020-12-01 NOTE — Chronic Care Management (AMB) (Signed)
Patient cancelled appointment. She did not want to reschedule at this time.  Berenice Bouton, CMA Clinical Pharmacist Assistant [phone]

## 2020-12-02 ENCOUNTER — Telehealth: Payer: Self-pay | Admitting: *Deleted

## 2020-12-02 NOTE — Chronic Care Management (AMB) (Signed)
  Care Management   Note  12/02/2020 Name: Patricia Aguirre MRN: [id number] DOB: May 03, 1951  Patricia Aguirre is a 70 y.o. year old female who is a primary care patient of Deeann Saint, MD and is actively engaged with the care management team. I reached out to Gardiner Ramus by phone today to assist with re-scheduling an initial visit with the Pharmacist  Follow up plan: Patient declines to reschedule call with PharmD at this time. Appropriate care team members and provider have been notified via electronic communication.  Patricia Aguirre  Care Guide, Embedded Care Coordination [facility]  [place], [place] [address] Direct Dial: [phone] [email or web address] Website: [email or web address]

## 2020-12-06 ENCOUNTER — Ambulatory Visit: Payer: Medicare HMO

## 2021-01-11 ENCOUNTER — Other Ambulatory Visit: Payer: Self-pay | Admitting: Family Medicine

## 2021-01-16 ENCOUNTER — Other Ambulatory Visit: Payer: Self-pay | Admitting: Family Medicine

## 2021-02-13 ENCOUNTER — Ambulatory Visit (INDEPENDENT_AMBULATORY_CARE_PROVIDER_SITE_OTHER): Payer: Medicare HMO

## 2021-02-13 ENCOUNTER — Other Ambulatory Visit: Payer: Self-pay

## 2021-02-13 DIAGNOSIS — Z0000 Encounter for general adult medical examination without abnormal findings: Secondary | ICD-10-CM

## 2021-02-13 NOTE — Progress Notes (Signed)
I connected with  Gardiner Ramus on 02/13/21 by a video enabled telemedicine application and verified that I am speaking with the correct person using two identifiers.   I discussed the limitations of evaluation and management by telemedicine. The patient expressed understanding and agreed to proceed.   Location of Patient: Home Location of Provider:Office    Subjective:   Patricia Aguirre is a 70 y.o. female who presents for Medicare Annual (Subsequent) preventive examination.  Review of Systems     Cardiac Risk Factors include: none     Objective:    Today's Vitals   02/13/21 1035  PainSc: 3    There is no height or weight on file to calculate BMI.  Advanced Directives 02/13/2021 07/10/2018 06/24/2018 05/27/2014 05/25/2014 04/09/2014 03/19/2014  Does Patient Have a Medical Advance Directive? No No No No No No No  Would patient like information on creating a medical advance directive? - No - Patient declined No - Patient declined No - patient declined information No - patient declined information No - patient declined information No - patient declined information    Current Medications (verified) Outpatient Encounter Medications as of 02/13/2021  Medication Sig   acetaminophen (TYLENOL) 500 MG tablet Take 1 tablet (500 mg total) by mouth every 8 (eight) hours as needed for moderate pain. (Patient not taking: No sig reported)   BLACK CURRANT SEED OIL PO Take by mouth.   CALCIUM CARB-ERGOCALCIFEROL PO Take by mouth. Calcium Xrtra: 1000 mg calcium, 400 iu vit D, 4,000 iu Vit A, 100 mg Vit C, 10 mg iron, and 500 mg magnesium. Take 3 by mouth daily with meal   Cholecalciferol (VITAMIN D) 2000 units CAPS Take 2,000 Units by mouth daily.   Clobetasol Prop Emollient Base 0.05 % emollient cream Apply topically twice a day for the next 7 days.   diclofenac sodium (VOLTAREN) 1 % GEL Apply 2 g topically 4 (four) times daily.   fluticasone (FLONASE) 50 MCG/ACT nasal spray PLACE 1 SPRAY INTO BOTH  NOSTRILS DAILY. FOR ALLERGIC RHINITIS   guaiFENesin (MUCINEX) 600 MG 12 hr tablet Take 600 mg by mouth 2 (two) times daily as needed.    hydrochlorothiazide (HYDRODIURIL) 25 MG tablet TAKE 1 TABLET (25 MG TOTAL) BY MOUTH DAILY.   losartan (COZAAR) 100 MG tablet TAKE 1 TABLET BY MOUTH EVERY DAY   milk thistle 175 MG tablet Take 175 mg by mouth 3 (three) times daily.   omeprazole (PRILOSEC) 20 MG capsule TAKE 1 CAPSULE BY MOUTH DAILY AS NEEDED. FOR ACID REFLUX (Patient not taking: Reported on 08/29/2020)   UNABLE TO FIND Med Name: Cell Food Capsules   No facility-administered encounter medications on file as of 02/13/2021.    Allergies (verified) Patient has no known allergies.   History: Past Medical History:  Diagnosis Date   Arthritis    Coronary artery disease    H/O hiatal hernia    Hepatitis    Hep C; dx 1 week ago 10/2113   Hypertension    Wears glasses    Past Surgical History:  Procedure Laterality Date   BREAST LUMPECTOMY WITH RADIOACTIVE SEED LOCALIZATION Right 05/27/2014   Procedure: RIGHT BREAST LUMPECTOMY WITH RADIOACTIVE SEED LOCALIZATION;  Surgeon: Almond Lint, MD;  Location: [facility];  Service: General;  Laterality: Right;   BREAST SURGERY     breast biopsy-right, atypical cells   COLONOSCOPY     COLONOSCOPY WITH PROPOFOL N/A 10/27/2013   Procedure: COLONOSCOPY WITH PROPOFOL;  Surgeon: Jone Baseman  Laural Benes, MD;  Location: [facility];  Service: Endoscopy;  Laterality: N/A;   ESOPHAGOGASTRODUODENOSCOPY (EGD) WITH PROPOFOL N/A 04/12/2014   Procedure: ESOPHAGOGASTRODUODENOSCOPY (EGD) WITH PROPOFOL;  Surgeon: Charolett Bumpers, MD;  Location: [facility];  Service: Endoscopy;  Laterality: N/A;   SEPTOPLASTY  2005   TUBAL LIGATION     Family History  Problem Relation Age of Onset   Diabetes Mellitus I Mother    Stroke Mother    Coronary artery disease Father    Heart disease Father    Diabetes Sister    Kidney disease Sister    Heart disease Brother     Pancreatitis Brother    Kidney disease Brother    Social History   Socioeconomic History   Marital status: Single    Spouse name: Not on file   Number of children: 1   Years of education: 14   Highest education level: Not on file  Occupational History   Not on file  Tobacco Use   Smoking status: Former    Packs/day: 0.25    Years: 20.00    Pack years: 5.00    Types: Cigarettes    Quit date: 05/25/2013    Years since quitting: 7.7   Smokeless tobacco: Never  Substance and Sexual Activity   Alcohol use: Yes    Comment: occ   Drug use: No   Sexual activity: Never    Comment: stopped-  Other Topics Concern   Not on file  Social History Narrative   Current smoker- 5/day   Alcohol use - Occasionally   Caffeine- coffee/tea   Marital- Single   House-1 story, lives alone. Pets-No   Highest level of education- [employer]   Current or past Programme researcher, broadcasting/film/video, Clinical biochemist   Exercise- NO   Fun: Not much fun right now.    Denies abuse and feels safe at home.    Social Determinants of Health   Financial Resource Strain: Low Risk    Difficulty of Paying Living Expenses: Not very hard  Food Insecurity: No Food Insecurity   Worried About Programme researcher, broadcasting/film/video in the Last Year: Never true   Ran Out of Food in the Last Year: Never true  Transportation Needs: No Transportation Needs   Lack of Transportation (Medical): No   Lack of Transportation (Non-Medical): No  Physical Activity: Inactive   Days of Exercise per Week: 0 days   Minutes of Exercise per Session: 0 min  Stress: No Stress Concern Present   Feeling of Stress : Only a little  Social Connections: Socially Isolated   Frequency of Communication with Friends and Family: Once a week   Frequency of Social Gatherings with Friends and Family: Once a week   Attends Religious Services: 1 to 4 times per year   Active Member of [employer] or Organizations: No   Attends Engineer, structural: Never    Marital Status: Never married    Tobacco Counseling Counseling given: Not Answered   Clinical Intake:     Pain : 0-10 Pain Score: 3  Pain Type: Chronic pain Pain Location: Back     Diabetes: No  How often do you need to have someone help you when you read instructions, pamphlets, or other written materials from your doctor or pharmacy?: 1 - Never What is the last grade level you completed in school?: college  Diabetic?no  Interpreter Needed?: No      Activities of Daily Living In your present state of health, do you  have any difficulty performing the following activities: 02/13/2021  Hearing? N  Vision? Y  Difficulty concentrating or making decisions? N  Walking or climbing stairs? N  Dressing or bathing? N  Doing errands, shopping? N  Preparing Food and eating ? N  Using the Toilet? N  In the past six months, have you accidently leaked urine? N  Do you have problems with loss of bowel control? N  Managing your Medications? N  Managing your Finances? N  Housekeeping or managing your Housekeeping? N  Some recent data might be hidden    Patient Care Team: Deeann Saint, MD as PCP - General (Family Medicine) Verner Chol, [facility] as Pharmacist (Pharmacist)  Indicate any recent Medical Services you may have received from other than [facility] providers in the past year (date may be approximate).     Assessment:   This is a routine wellness examination for Patricia Aguirre.  Hearing/Vision screen No results found.  Dietary issues and exercise activities discussed: Current Exercise Habits: The patient does not participate in regular exercise at present, Exercise limited by: None identified   Goals Addressed   None    Depression Screen PHQ 2/9 Scores 02/13/2021 08/29/2020 08/27/2019 04/29/2019 08/01/2017 07/25/2017 07/17/2017  PHQ - 2 Score 0 4 5 0 0 0 0  PHQ- 9 Score - 20 17 - - - -    Fall Risk Fall Risk  02/13/2021 08/29/2020 07/10/2018 06/24/2018 09/24/2017  Falls in the  past year? 0 0 0 0 No  Number falls in past yr: - - 0 0 -  Injury with Fall? - - 0 0 -  Risk for fall due to : No Fall Risks - - - -  Follow up Falls evaluation completed - - - -    FALL RISK PREVENTION PERTAINING TO THE HOME:  Any stairs in or around the home? No  If so, are there any without handrails?  N/a Home free of loose throw rugs in walkways, pet beds, electrical cords, etc? Yes  Adequate lighting in your home to reduce risk of falls? Yes   ASSISTIVE DEVICES UTILIZED TO PREVENT FALLS:  Life alert? No  Use of a cane, walker or w/c? No  Grab bars in the bathroom? No  Shower chair or bench in shower? No  Elevated toilet seat or a handicapped toilet? No   TIMED UP AND GO:  Was the test performed? No .  Length of time to ambulate 10 feet:  sec.     Cognitive Function: MMSE - Mini Mental State Exam 09/24/2017  Orientation to time 5  Orientation to Place 5  Registration 3  Attention/ Calculation 4  Recall 3  Language- name 2 objects 2  Language- repeat 1  Language- follow 3 step command 3  Language- read & follow direction 1  Write a sentence 1  Copy design 1  Total score 29     6CIT Screen 02/13/2021  What Year? 0 points  What month? 0 points  What time? 0 points  Count back from 20 4 points  Months in reverse 0 points  Repeat phrase 0 points  Total Score 4    Immunizations Immunization History  Administered Date(s) Administered   Hepatitis A, Adult 07/07/2014, 01/18/2015   Hepatitis B, adult 07/07/2014, 09/21/2014, 01/18/2015   Influenza-Unspecified 03/11/2013, 04/13/2014   PFIZER(Purple Top)SARS-COV-2 Vaccination 08/10/2019, 09/12/2019   Pneumococcal Conjugate-13 08/27/2019   Pneumococcal-Unspecified 06/11/2013   Tdap 08/27/2019    TDAP status: Up to date  Flu Vaccine  status: Due, Education has been provided regarding the importance of this vaccine. Advised may receive this vaccine at local pharmacy or Health Dept. Aware to provide a copy of the  vaccination record if obtained from local pharmacy or Health Dept. Verbalized acceptance and understanding.  Pneumococcal vaccine status: Due, Education has been provided regarding the importance of this vaccine. Advised may receive this vaccine at local pharmacy or Health Dept. Aware to provide a copy of the vaccination record if obtained from local pharmacy or Health Dept. Verbalized acceptance and understanding.  Covid-19 vaccine status: Completed vaccines  Qualifies for Shingles Vaccine? No   Zostavax completed No   Shingrix Completed?: No.    Education has been provided regarding the importance of this vaccine. Patient has been advised to call insurance company to determine out of pocket expense if they have not yet received this vaccine. Advised may also receive vaccine at local pharmacy or Health Dept. Verbalized acceptance and understanding.  Screening Tests Health Maintenance  Topic Date Due   Zoster Vaccines- Shingrix (1 of 2) Never done   COVID-19 Vaccine (3 - Pfizer risk series) 10/10/2019   MAMMOGRAM  04/11/2020   PNA vac Low Risk Adult (2 of 2 - PPSV23) 08/26/2020   INFLUENZA VACCINE  01/09/2021   COLONOSCOPY (Pts 45-2175yrs Insurance coverage will need to be confirmed)  10/28/2023   TETANUS/TDAP  08/26/2029   DEXA SCAN  Completed   Hepatitis C Screening  Completed   HPV VACCINES  Aged Out    Health Maintenance  Health Maintenance Due  Topic Date Due   Zoster Vaccines- Shingrix (1 of 2) Never done   COVID-19 Vaccine (3 - Pfizer risk series) 10/10/2019   MAMMOGRAM  04/11/2020   PNA vac Low Risk Adult (2 of 2 - PPSV23) 08/26/2020   INFLUENZA VACCINE  01/09/2021    Colorectal cancer screening: Type of screening: Colonoscopy. Completed 10/27/2013. Repeat every 10 years  Mammogram status: Completed 2021 per pt. Repeat every year  Bone Density status: Completed 07/03/18. Results reflect: Bone density results: NORMAL. Repeat every 2 years.  Lung Cancer Screening: (Low  Dose CT Chest recommended if Age 41-80 years, 30 pack-year currently smoking OR have quit w/in 15years.) does qualify.   Lung Cancer Screening Referral: no  Additional Screening:  Hepatitis C Screening: does qualify; Completed 08/27/19  Vision Screening: Recommended annual ophthalmology exams for early detection of glaucoma and other disorders of the eye. Is the patient up to date with their annual eye exam?  No  Who is the provider or what is the name of the office in which the patient attends annual eye exams? [facility] If pt is not established with a provider, would they like to be referred to a provider to establish care? No .   Dental Screening: Recommended annual dental exams for proper oral hygiene  Community Resource Referral / Chronic Care Management: CRR required this visit?  No   CCM required this visit?  No      Plan:     I have personally reviewed and noted the following in the patient's chart:   Medical and social history Use of alcohol, tobacco or illicit drugs  Current medications and supplements including opioid prescriptions.  Functional ability and status Nutritional status Physical activity Advanced directives List of other physicians Hospitalizations, surgeries, and ER visits in previous 12 months Vitals Screenings to include cognitive, depression, and falls Referrals and appointments  In addition, I have reviewed and  discussed with patient certain preventive protocols, quality metrics, and best practice recommendations. A written personalized care plan for preventive services as well as general preventive health recommendations were provided to patient.     Judith Part, CMA   02/13/2021   Nurse Notes:   Non-Face to Face 45 minute visit Encounter  Patricia Aguirre , Thank you for taking time to come for your Medicare Wellness Visit. I appreciate your ongoing commitment to your health goals. Please  review the following plan we discussed and let me know if I can assist you in the future.   These are the goals we discussed:  Goals   None     This is a list of the screening recommended for you and due dates:  Health Maintenance  Topic Date Due   Zoster (Shingles) Vaccine (1 of 2) Never done   COVID-19 Vaccine (3 - Pfizer risk series) 10/10/2019   Mammogram  04/11/2020   Pneumonia vaccines (2 of 2 - PPSV23) 08/26/2020   Flu Shot  01/09/2021   Colon Cancer Screening  10/28/2023   Tetanus Vaccine  08/26/2029   DEXA scan (bone density measurement)  Completed   Hepatitis C Screening: USPSTF Recommendation to screen - Ages 11-79 yo.  Completed   HPV Vaccine  Aged Out

## 2021-02-14 ENCOUNTER — Telehealth: Payer: Self-pay | Admitting: Pharmacist

## 2021-02-14 NOTE — Chronic Care Management (AMB) (Signed)
    Chronic Care Management Pharmacy Assistant   Name: Patricia Aguirre  MRN: [id number] DOB: 01-07-1951  Called patient to reschedule appointment with Gaylord Shih the clinical pharmacist. Patient states she does not know why she is to see madeline and what the benefit is. Patient request for her pcp to call and tell her its okay to see madeline before she reschedules. Message sent to [facility] pryor to handle and make aware.  Medications: Outpatient Encounter Medications as of 02/14/2021  Medication Sig   acetaminophen (TYLENOL) 500 MG tablet Take 1 tablet (500 mg total) by mouth every 8 (eight) hours as needed for moderate pain. (Patient not taking: No sig reported)   BLACK CURRANT SEED OIL PO Take by mouth.   CALCIUM CARB-ERGOCALCIFEROL PO Take by mouth. Calcium Xrtra: 1000 mg calcium, 400 iu vit D, 4,000 iu Vit A, 100 mg Vit C, 10 mg iron, and 500 mg magnesium. Take 3 by mouth daily with meal   Cholecalciferol (VITAMIN D) 2000 units CAPS Take 2,000 Units by mouth daily.   Clobetasol Prop Emollient Base 0.05 % emollient cream Apply topically twice a day for the next 7 days.   diclofenac sodium (VOLTAREN) 1 % GEL Apply 2 g topically 4 (four) times daily.   fluticasone (FLONASE) 50 MCG/ACT nasal spray PLACE 1 SPRAY INTO BOTH NOSTRILS DAILY. FOR ALLERGIC RHINITIS   guaiFENesin (MUCINEX) 600 MG 12 hr tablet Take 600 mg by mouth 2 (two) times daily as needed.    hydrochlorothiazide (HYDRODIURIL) 25 MG tablet TAKE 1 TABLET (25 MG TOTAL) BY MOUTH DAILY.   losartan (COZAAR) 100 MG tablet TAKE 1 TABLET BY MOUTH EVERY DAY   milk thistle 175 MG tablet Take 175 mg by mouth 3 (three) times daily.   omeprazole (PRILOSEC) 20 MG capsule TAKE 1 CAPSULE BY MOUTH DAILY AS NEEDED. FOR ACID REFLUX (Patient not taking: Reported on 08/29/2020)   UNABLE TO FIND Med Name: Cell Food Capsules   No facility-administered encounter medications on file as of 02/14/2021.    Care Gaps:  AWV - completed on 02/13/21 Zoster  vaccines - never done Covid-19 vaccine booster 3 - overdue since 10/10/19 Mammogram -  overdue since 11/21 Pneumonia vaccine -  overdue since 08/26/20 Flu vaccine -  due  Star Rating Drugs:  Losartan 100mg  - last filled on 01/13/21 90DS at [facility]  03/15/21 CMA  Clinical Pharmacist Assistant [phone]

## 2021-02-15 DIAGNOSIS — H524 Presbyopia: Secondary | ICD-10-CM | POA: Diagnosis not present

## 2021-03-13 DIAGNOSIS — Z0100 Encounter for examination of eyes and vision without abnormal findings: Secondary | ICD-10-CM | POA: Diagnosis not present

## 2021-03-21 ENCOUNTER — Telehealth: Payer: Self-pay | Admitting: Pharmacist

## 2021-03-21 NOTE — Chronic Care Management (AMB) (Signed)
    Chronic Care Management Pharmacy Assistant   Name: Patricia Aguirre  MRN: [id number] DOB: May 06, 1951  Reason for Encounter: Reschedule initial appointment    Medications: Outpatient Encounter Medications as of 03/21/2021  Medication Sig   acetaminophen (TYLENOL) 500 MG tablet Take 1 tablet (500 mg total) by mouth every 8 (eight) hours as needed for moderate pain. (Patient not taking: No sig reported)   BLACK CURRANT SEED OIL PO Take by mouth.   CALCIUM CARB-ERGOCALCIFEROL PO Take by mouth. Calcium Xrtra: 1000 mg calcium, 400 iu vit D, 4,000 iu Vit A, 100 mg Vit C, 10 mg iron, and 500 mg magnesium. Take 3 by mouth daily with meal   Cholecalciferol (VITAMIN D) 2000 units CAPS Take 2,000 Units by mouth daily.   Clobetasol Prop Emollient Base 0.05 % emollient cream Apply topically twice a day for the next 7 days.   diclofenac sodium (VOLTAREN) 1 % GEL Apply 2 g topically 4 (four) times daily.   fluticasone (FLONASE) 50 MCG/ACT nasal spray PLACE 1 SPRAY INTO BOTH NOSTRILS DAILY. FOR ALLERGIC RHINITIS   guaiFENesin (MUCINEX) 600 MG 12 hr tablet Take 600 mg by mouth 2 (two) times daily as needed.    hydrochlorothiazide (HYDRODIURIL) 25 MG tablet TAKE 1 TABLET (25 MG TOTAL) BY MOUTH DAILY.   losartan (COZAAR) 100 MG tablet TAKE 1 TABLET BY MOUTH EVERY DAY   milk thistle 175 MG tablet Take 175 mg by mouth 3 (three) times daily.   omeprazole (PRILOSEC) 20 MG capsule TAKE 1 CAPSULE BY MOUTH DAILY AS NEEDED. FOR ACID REFLUX (Patient not taking: Reported on 08/29/2020)   UNABLE TO FIND Med Name: Cell Food Capsules   No facility-administered encounter medications on file as of 03/21/2021.  Note:  Patient not returning calls, note closed after multiple attempts.  Care Gaps: AWV - completed on 02/13/21 Zoster vaccines - never done Covid-19 vaccine booster 3 - overdue since 10/10/19 Mammogram -  overdue since 11/21 Pneumonia vaccine -  overdue since 08/26/20 Flu vaccine -  due Last BP reading - 124/78  on 08/29/2020  Star Rating Drugs: Losartan 100mg  - last filled on 01/13/21 90[facility] at [facility]  03/15/21 [facility]  Clinical Pharmacist Assistant [phone]

## 2021-04-13 ENCOUNTER — Telehealth: Payer: Self-pay | Admitting: Pharmacist

## 2021-04-13 NOTE — Chronic Care Management (AMB) (Signed)
    Chronic Care Management Pharmacy Assistant   Name: Patricia Aguirre  MRN: [id number] DOB: 04/26/1951  Reason for Encounter: Reschedule initial appointment that was cancelled in June 2022, last attempted to contact patient to reschedule initial visit last month 03/21/2021.  Unable to reach patient after several attempts.  Care Gaps: AWV - completed on 02/13/21 Zoster vaccines - never done Covid-19 vaccine booster 3 - overdue since 10/10/19 Mammogram -  overdue since 11/21 Pneumonia vaccine -  overdue since 08/26/20 Flu vaccine -  due Last BP reading - 124/78 on 08/29/2020  Star Rating Drugs: Losartan 100mg  - last filled on 01/13/21 [facility] at [facility]  03/15/21 [facility]  Clinical Pharmacist Assistant [phone]

## 2021-06-01 DIAGNOSIS — Z8619 Personal history of other infectious and parasitic diseases: Secondary | ICD-10-CM | POA: Diagnosis not present

## 2021-06-01 DIAGNOSIS — E785 Hyperlipidemia, unspecified: Secondary | ICD-10-CM | POA: Diagnosis not present

## 2021-06-01 DIAGNOSIS — R7303 Prediabetes: Secondary | ICD-10-CM | POA: Diagnosis not present

## 2021-06-01 DIAGNOSIS — Z0000 Encounter for general adult medical examination without abnormal findings: Secondary | ICD-10-CM | POA: Diagnosis not present

## 2021-06-01 DIAGNOSIS — Z6827 Body mass index (BMI) 27.0-27.9, adult: Secondary | ICD-10-CM | POA: Diagnosis not present

## 2021-06-01 DIAGNOSIS — E663 Overweight: Secondary | ICD-10-CM | POA: Diagnosis not present

## 2021-06-01 DIAGNOSIS — Z79899 Other long term (current) drug therapy: Secondary | ICD-10-CM | POA: Diagnosis not present

## 2021-06-01 DIAGNOSIS — R0989 Other specified symptoms and signs involving the circulatory and respiratory systems: Secondary | ICD-10-CM | POA: Diagnosis not present

## 2021-06-01 DIAGNOSIS — K219 Gastro-esophageal reflux disease without esophagitis: Secondary | ICD-10-CM | POA: Diagnosis not present

## 2021-06-01 DIAGNOSIS — I10 Essential (primary) hypertension: Secondary | ICD-10-CM | POA: Diagnosis not present

## 2021-07-18 ENCOUNTER — Other Ambulatory Visit: Payer: Self-pay | Admitting: Family Medicine

## 2021-08-04 ENCOUNTER — Telehealth: Payer: Self-pay | Admitting: Pharmacist

## 2021-08-04 NOTE — Chronic Care Management (AMB) (Signed)
° ° °  Chronic Care Management Pharmacy Assistant   Name: Patricia Aguirre  MRN: [id number] DOB: 1950-11-25  Reason for Encounter: Reschedule initial appointment canceled 12/06/2020 with Gaylord Shih. Spoke with patient, she states she is no longer a patient of Dr. Salomon Fick and is no longer with her office.  She did not wish to let us know what office she was with. Explained to her what we can do for her and she stated she does not want to be enrolled with [place] any longer.   [name] [facility]  Clinical Pharmacist Assistant [phone]

## 2021-09-26 ENCOUNTER — Encounter: Payer: Self-pay | Admitting: Podiatrist

## 2021-09-26 ENCOUNTER — Ambulatory Visit: Payer: Medicare HMO | Admitting: Podiatrist

## 2021-09-26 DIAGNOSIS — M2041 Other hammer toe(s) (acquired), right foot: Secondary | ICD-10-CM | POA: Diagnosis not present

## 2021-09-26 DIAGNOSIS — M2042 Other hammer toe(s) (acquired), left foot: Secondary | ICD-10-CM

## 2021-09-26 DIAGNOSIS — G629 Polyneuropathy, unspecified: Secondary | ICD-10-CM | POA: Diagnosis not present

## 2021-09-26 NOTE — Patient Instructions (Signed)
I will set you up to see someone to check out your back and to see if your pain in your feet is related to your back. ? ?Their office will give you a call and set up an appointment to be seen ? ?If you are interested in discussing getting your hammertoes fixed,  call and ask to see Dr. Charlsie Merles.   ?

## 2021-09-26 NOTE — Progress Notes (Signed)
?Chief Complaint  ?Patient presents with  ? Numbness  ?  (New Patient) ?Diagnosis: Paresthesia of both feet Referring Provider: Hillery Aldo  ?  ? ?HPI: Patient is 71 y.o. female who presents today for numbness in her feet with a prickly feeling.  She states there is also a pulling sensation in her feet and legs when she moves her back a certain way.  She relates she has had back pain in the past but has not yet had it checked out.  She relates she is not diabetic.  She has had the feelings for about 10 years and they have recently become worse over time.  She has not been on any medication for this problem.   ?Patricia Aguirre also has some uncomfortable hammertoes 1-3 of both feet and she would like to know if there are any options for treatment.  ? ?Patient Active Problem List  ? Diagnosis Date Noted  ? Kidney disease 07/29/2019  ? History of hepatitis C 05/04/2019  ? Neuropathy 05/04/2019  ? Cigarette nicotine dependence without complication 05/04/2019  ? Mixed hyperlipidemia 01/10/2018  ? Prediabetes 01/10/2018  ? Tobacco abuse 01/10/2018  ? Bad odor of urine 04/09/2016  ? Post-nasal drainage 02/14/2016  ? Nasal obstruction 10/13/2015  ? Dyspnea 10/13/2015  ? Routine general medical examination at a health care facility 10/12/2015  ? GERD (gastroesophageal reflux disease) 10/12/2015  ? Sleep disturbance 10/12/2015  ? Hepatic cirrhosis (HCC) 12/14/2013  ? Hepatitis C virus infection without hepatic coma 11/23/2013  ? Encephalopathy, hepatic (HCC) 11/23/2013  ? Hypertension 08/22/2013  ? ? ?Current Outpatient Medications on File Prior to Visit  ?Medication Sig Dispense Refill  ? acetaminophen (TYLENOL) 500 MG tablet Take 1 tablet (500 mg total) by mouth every 8 (eight) hours as needed for moderate pain. (Patient not taking: No sig reported) 30 tablet 0  ? atorvastatin (LIPITOR) 20 MG tablet Take 20 mg by mouth at bedtime.    ? BLACK CURRANT SEED OIL PO Take by mouth.    ? CALCIUM CARB-ERGOCALCIFEROL PO Take by mouth.  Calcium Xrtra: 1000 mg calcium, 400 iu vit D, 4,000 iu Vit A, 100 mg Vit C, 10 mg iron, and 500 mg magnesium. Take 3 by mouth daily with meal    ? Cholecalciferol (VITAMIN D) 2000 units CAPS Take 2,000 Units by mouth daily.    ? Clobetasol Prop Emollient Base 0.05 % emollient cream Apply topically twice a day for the next 7 days. 30 g 0  ? diclofenac sodium (VOLTAREN) 1 % GEL Apply 2 g topically 4 (four) times daily. 1 Tube 3  ? fluticasone (FLONASE) 50 MCG/ACT nasal spray PLACE 1 SPRAY INTO BOTH NOSTRILS DAILY. FOR ALLERGIC RHINITIS 16 g 2  ? guaiFENesin (MUCINEX) 600 MG 12 hr tablet Take 600 mg by mouth 2 (two) times daily as needed.     ? hydrochlorothiazide (HYDRODIURIL) 25 MG tablet TAKE 1 TABLET (25 MG TOTAL) BY MOUTH DAILY. 90 tablet 2  ? losartan (COZAAR) 100 MG tablet TAKE 1 TABLET BY MOUTH EVERY DAY 90 tablet 0  ? milk thistle 175 MG tablet Take 175 mg by mouth 3 (three) times daily.    ? omeprazole (PRILOSEC) 20 MG capsule TAKE 1 CAPSULE BY MOUTH DAILY AS NEEDED. FOR ACID REFLUX (Patient not taking: Reported on 08/29/2020) 90 capsule 1  ? UNABLE TO FIND Med Name: Cell Food Capsules    ? ?No current facility-administered medications on file prior to visit.  ? ? ?No Known Allergies ? ?Review  of Systems ?No fevers, chills, nausea, muscle aches, no difficulty breathing, no calf pain, no chest pain or shortness of breath. ? ? ?Physical Exam ? ?GENERAL APPEARANCE: Alert, conversant. Appropriately groomed. No acute distress.  ? ?VASCULAR: Pedal pulses palpable DP and PT bilateral.  Capillary refill time is immediate to all digits,  Proximal to distal cooling it warm to warm.  Digital perfusion adequate.  ? ?NEUROLOGIC: sensation is intact to 5.07 monofilament at 4/5 sites bilateral. Fifth metatarsal/ lateral foot have absent sensation to monofilament.    Cool perception is intact bilateral, vibratory sensation intact bilateral, negative tinnels sign noted bilateral.  ? ?MUSCULOSKELETAL: acceptable muscle  strength, tone and stability bilateral.  No gross boney pedal deformities noted.  Mallet toe of the distal interphalangeal joint 1,2 3 toes bilateral is noted.  Semi rigid in nature and uncomfortable with manipulation.   ? ?DERMATOLOGIC: skin is warm, supple, and dry.  Color, texture, and turgor of skin within normal limits.  No open wounds are noted.  No preulcerative lesions are seen.  Digital nails are asymptomatic.   ? ? ? ?Assessment  ? ?  ICD-10-CM   ?1. Neuropathy  G62.9   ?  ?2. Hammertoe, bilateral  M20.41   ? M20.42   ?  ? ? ? ?Plan ? ?Discussed with Carney Bern exam findings as well as treatment recommendations.  Discussed that the sensation she is feeling in her feet does not appear to be originating from her feet and that is a possibility she could have some issues with her back causing the discomfort/numbness in her feet.  I recommended a referral to [facility] to investigate further. ?For the hammertoes I discussed conservative versus surgical options.  Discussed that no treatment is recommended unless the toes are significantly painful.  If she decides she wants to discuss further she will call and see Dr. Charlsie Merles for evaluation of the hammertoes. ?In the meantime if she has any questions or concerns she will call and let us know. ?

## 2021-09-27 ENCOUNTER — Other Ambulatory Visit: Payer: Self-pay | Admitting: Student

## 2021-09-27 ENCOUNTER — Other Ambulatory Visit (HOSPITAL_BASED_OUTPATIENT_CLINIC_OR_DEPARTMENT_OTHER): Payer: Self-pay | Admitting: Student

## 2021-09-27 ENCOUNTER — Telehealth: Payer: Self-pay | Admitting: *Deleted

## 2021-09-27 DIAGNOSIS — R0989 Other specified symptoms and signs involving the circulatory and respiratory systems: Secondary | ICD-10-CM

## 2021-09-27 NOTE — Telephone Encounter (Signed)
Called [facility]  and faxed referral,notes , received confirmation 09/27/21. ?

## 2021-10-03 ENCOUNTER — Ambulatory Visit (HOSPITAL_COMMUNITY): Payer: Medicare HMO

## 2021-10-03 ENCOUNTER — Encounter (HOSPITAL_COMMUNITY): Payer: Self-pay

## 2021-10-10 ENCOUNTER — Other Ambulatory Visit: Payer: Self-pay | Admitting: Nurse Practitioner

## 2021-10-10 DIAGNOSIS — K7469 Other cirrhosis of liver: Secondary | ICD-10-CM

## 2021-10-16 ENCOUNTER — Other Ambulatory Visit: Payer: Self-pay | Admitting: Family Medicine

## 2021-10-17 ENCOUNTER — Ambulatory Visit
Admission: RE | Admit: 2021-10-17 | Discharge: 2021-10-17 | Disposition: A | Discharge status: Home / Self Care | Payer: Medicare HMO | Source: Ambulatory Visit | Attending: Nurse Practitioner | Admitting: Nurse Practitioner

## 2021-10-17 DIAGNOSIS — K7469 Other cirrhosis of liver: Secondary | ICD-10-CM

## 2021-10-18 ENCOUNTER — Ambulatory Visit (HOSPITAL_COMMUNITY)
Admission: RE | Admit: 2021-10-18 | Discharge: 2021-10-18 | Disposition: A | Discharge status: Home / Self Care | Payer: Medicare HMO | Source: Ambulatory Visit | Attending: Student | Admitting: Student

## 2021-10-18 DIAGNOSIS — R0989 Other specified symptoms and signs involving the circulatory and respiratory systems: Secondary | ICD-10-CM | POA: Diagnosis present

## 2021-11-03 ENCOUNTER — Other Ambulatory Visit: Payer: Self-pay | Admitting: Family Medicine

## 2021-11-20 ENCOUNTER — Other Ambulatory Visit: Payer: Self-pay | Admitting: Family Medicine

## 2021-12-15 ENCOUNTER — Other Ambulatory Visit: Payer: Self-pay | Admitting: Family Medicine

## 2022-01-04 ENCOUNTER — Institutional Professional Consult (permissible substitution): Payer: Medicare HMO | Admitting: Student

## 2022-01-11 ENCOUNTER — Institutional Professional Consult (permissible substitution): Payer: Medicare HMO | Admitting: Pulmonary Disease

## 2022-04-17 ENCOUNTER — Other Ambulatory Visit: Payer: Self-pay | Admitting: Nurse Practitioner

## 2022-04-17 DIAGNOSIS — K7469 Other cirrhosis of liver: Secondary | ICD-10-CM

## 2022-04-30 ENCOUNTER — Ambulatory Visit
Admission: RE | Admit: 2022-04-30 | Discharge: 2022-04-30 | Disposition: A | Discharge status: Home / Self Care | Payer: Medicare HMO | Source: Ambulatory Visit | Attending: Nurse Practitioner | Admitting: Nurse Practitioner

## 2022-04-30 DIAGNOSIS — K7469 Other cirrhosis of liver: Secondary | ICD-10-CM

## 2022-10-16 ENCOUNTER — Other Ambulatory Visit: Payer: Self-pay | Admitting: Nurse Practitioner

## 2022-10-16 DIAGNOSIS — K7469 Other cirrhosis of liver: Secondary | ICD-10-CM

## 2022-11-07 ENCOUNTER — Ambulatory Visit
Admission: RE | Admit: 2022-11-07 | Discharge: 2022-11-07 | Disposition: A | Discharge status: Home / Self Care | Payer: Medicare HMO | Source: Ambulatory Visit | Attending: Nurse Practitioner | Admitting: Nurse Practitioner

## 2022-11-07 DIAGNOSIS — K7469 Other cirrhosis of liver: Secondary | ICD-10-CM

## 2023-04-15 ENCOUNTER — Other Ambulatory Visit: Payer: Self-pay | Admitting: Nurse Practitioner

## 2023-04-15 DIAGNOSIS — K7469 Other cirrhosis of liver: Secondary | ICD-10-CM

## 2023-05-01 ENCOUNTER — Ambulatory Visit
Admission: RE | Admit: 2023-05-01 | Discharge: 2023-05-01 | Disposition: A | Discharge status: Home / Self Care | Payer: Medicare HMO | Source: Ambulatory Visit | Attending: Nurse Practitioner | Admitting: Nurse Practitioner

## 2023-05-01 DIAGNOSIS — K7469 Other cirrhosis of liver: Secondary | ICD-10-CM

## 2023-10-17 ENCOUNTER — Other Ambulatory Visit: Payer: Self-pay | Admitting: Nurse Practitioner

## 2023-10-17 DIAGNOSIS — K7469 Other cirrhosis of liver: Secondary | ICD-10-CM

## 2023-10-25 ENCOUNTER — Ambulatory Visit
Admission: RE | Admit: 2023-10-25 | Discharge: 2023-10-25 | Disposition: A | Discharge status: Home / Self Care | Source: Ambulatory Visit | Attending: Nurse Practitioner | Admitting: Nurse Practitioner

## 2023-10-25 DIAGNOSIS — K7469 Other cirrhosis of liver: Secondary | ICD-10-CM

## 2024-04-20 LAB — COLOGUARD: COLOGUARD: NEGATIVE

## 2024-04-28 ENCOUNTER — Other Ambulatory Visit: Payer: Self-pay | Admitting: Nurse Practitioner

## 2024-04-28 DIAGNOSIS — K7469 Other cirrhosis of liver: Secondary | ICD-10-CM

## 2024-06-17 ENCOUNTER — Ambulatory Visit
Admission: RE | Admit: 2024-06-17 | Discharge: 2024-06-17 | Disposition: A | Discharge status: Home / Self Care | Source: Ambulatory Visit | Attending: Nurse Practitioner | Admitting: Nurse Practitioner

## 2024-06-17 DIAGNOSIS — K7469 Other cirrhosis of liver: Secondary | ICD-10-CM
